# Patient Record
Sex: Male | Born: 1950 | Race: White | Hispanic: No | Smoking: Never smoker
Health system: Southern US, Community
[De-identification: ages and names within clinical notes are randomized; demographics above are authoritative.]

## PROBLEM LIST (undated history)

## (undated) DIAGNOSIS — E785 Hyperlipidemia, unspecified: Secondary | ICD-10-CM

## (undated) DIAGNOSIS — F329 Major depressive disorder, single episode, unspecified: Secondary | ICD-10-CM

## (undated) DIAGNOSIS — M1990 Unspecified osteoarthritis, unspecified site: Secondary | ICD-10-CM

## (undated) DIAGNOSIS — F32A Depression, unspecified: Secondary | ICD-10-CM

## (undated) DIAGNOSIS — Z87442 Personal history of urinary calculi: Secondary | ICD-10-CM

## (undated) DIAGNOSIS — D696 Thrombocytopenia, unspecified: Secondary | ICD-10-CM

## (undated) DIAGNOSIS — R161 Splenomegaly, not elsewhere classified: Secondary | ICD-10-CM

## (undated) DIAGNOSIS — K760 Fatty (change of) liver, not elsewhere classified: Secondary | ICD-10-CM

## (undated) DIAGNOSIS — K219 Gastro-esophageal reflux disease without esophagitis: Secondary | ICD-10-CM

## (undated) DIAGNOSIS — I10 Essential (primary) hypertension: Secondary | ICD-10-CM

## (undated) DIAGNOSIS — Z77098 Contact with and (suspected) exposure to other hazardous, chiefly nonmedicinal, chemicals: Secondary | ICD-10-CM

## (undated) DIAGNOSIS — K469 Unspecified abdominal hernia without obstruction or gangrene: Secondary | ICD-10-CM

## (undated) HISTORY — DX: Depression, unspecified: F32.A

## (undated) HISTORY — DX: Gastro-esophageal reflux disease without esophagitis: K21.9

## (undated) HISTORY — DX: Essential (primary) hypertension: I10

## (undated) HISTORY — DX: Major depressive disorder, single episode, unspecified: F32.9

## (undated) HISTORY — DX: Hyperlipidemia, unspecified: E78.5

---

## 2000-01-14 ENCOUNTER — Encounter: Admission: RE | Admit: 2000-01-14 | Discharge: 2000-01-14 | Payer: Self-pay | Admitting: Family Medicine

## 2000-01-14 ENCOUNTER — Encounter: Payer: Self-pay | Admitting: Family Medicine

## 2000-09-26 ENCOUNTER — Ambulatory Visit (HOSPITAL_BASED_OUTPATIENT_CLINIC_OR_DEPARTMENT_OTHER): Admission: RE | Admit: 2000-09-26 | Discharge: 2000-09-26 | Payer: Self-pay | Admitting: *Deleted

## 2000-12-09 ENCOUNTER — Ambulatory Visit (HOSPITAL_BASED_OUTPATIENT_CLINIC_OR_DEPARTMENT_OTHER): Admission: RE | Admit: 2000-12-09 | Discharge: 2000-12-09 | Payer: Self-pay | Admitting: *Deleted

## 2001-01-25 ENCOUNTER — Encounter (INDEPENDENT_AMBULATORY_CARE_PROVIDER_SITE_OTHER): Payer: Self-pay | Admitting: *Deleted

## 2001-01-25 ENCOUNTER — Ambulatory Visit (HOSPITAL_COMMUNITY): Admission: RE | Admit: 2001-01-25 | Discharge: 2001-01-26 | Payer: Self-pay | Admitting: *Deleted

## 2001-12-20 HISTORY — PX: UVULOPALATOPHARYNGOPLASTY: SHX827

## 2002-04-04 ENCOUNTER — Ambulatory Visit (HOSPITAL_COMMUNITY): Admission: RE | Admit: 2002-04-04 | Discharge: 2002-04-04 | Payer: Self-pay | Admitting: *Deleted

## 2002-04-04 ENCOUNTER — Encounter (INDEPENDENT_AMBULATORY_CARE_PROVIDER_SITE_OTHER): Payer: Self-pay | Admitting: Specialist

## 2003-07-01 ENCOUNTER — Encounter: Payer: Self-pay | Admitting: Family Medicine

## 2003-07-01 ENCOUNTER — Encounter: Admission: RE | Admit: 2003-07-01 | Discharge: 2003-07-01 | Payer: Self-pay | Admitting: Family Medicine

## 2004-12-31 ENCOUNTER — Ambulatory Visit (HOSPITAL_COMMUNITY): Admission: RE | Admit: 2004-12-31 | Discharge: 2004-12-31 | Payer: Self-pay | Admitting: *Deleted

## 2005-11-26 ENCOUNTER — Encounter: Admission: RE | Admit: 2005-11-26 | Discharge: 2006-02-24 | Payer: Self-pay | Admitting: Family Medicine

## 2005-12-03 ENCOUNTER — Encounter: Admission: RE | Admit: 2005-12-03 | Discharge: 2005-12-03 | Payer: Self-pay | Admitting: Family Medicine

## 2006-08-04 ENCOUNTER — Ambulatory Visit: Payer: Self-pay | Admitting: Family Medicine

## 2006-09-14 ENCOUNTER — Ambulatory Visit: Payer: Self-pay | Admitting: Family Medicine

## 2006-09-23 ENCOUNTER — Ambulatory Visit: Payer: Self-pay | Admitting: Family Medicine

## 2007-01-19 ENCOUNTER — Ambulatory Visit: Payer: Self-pay | Admitting: Family Medicine

## 2007-02-17 ENCOUNTER — Ambulatory Visit: Payer: Self-pay | Admitting: Family Medicine

## 2007-02-17 LAB — CONVERTED CEMR LAB
ALT: 31 units/L (ref 0–53)
AST: 27 units/L (ref 0–37)
BUN: 10 mg/dL (ref 6–23)
CO2: 26 meq/L (ref 19–32)
Calcium: 9 mg/dL (ref 8.4–10.5)
Chloride: 98 meq/L (ref 96–112)
Cholesterol: 281 mg/dL — ABNORMAL HIGH (ref 0–200)
Creatinine, Ser: 1.01 mg/dL (ref 0.40–1.50)
Glucose, Bld: 257 mg/dL — ABNORMAL HIGH (ref 70–99)
HDL: 25 mg/dL — ABNORMAL LOW (ref >39)
Hgb A1c MFr Bld: 8.2 % — ABNORMAL HIGH (ref 4.6–6.1)
Microalb, Ur: 1.31 mg/dL (ref 0.00–1.89)
Potassium: 4.5 meq/L (ref 3.5–5.3)
Sodium: 133 meq/L — ABNORMAL LOW (ref 135–145)
Total CHOL/HDL Ratio: 11.2
Triglycerides: 763 mg/dL — ABNORMAL HIGH (ref <150)

## 2007-02-28 ENCOUNTER — Ambulatory Visit: Payer: Self-pay | Admitting: Family Medicine

## 2007-02-28 LAB — CONVERTED CEMR LAB
ALT: 49 units/L — ABNORMAL HIGH (ref 0–40)
AST: 38 units/L — ABNORMAL HIGH (ref 0–37)
BUN: 8 mg/dL (ref 6–23)
CO2: 26 meq/L (ref 19–32)
Calcium: 8.8 mg/dL (ref 8.4–10.5)
Chloride: 101 meq/L (ref 96–112)
Cholesterol: 284 mg/dL (ref 0–200)
Creatinine, Ser: 0.8 mg/dL (ref 0.4–1.5)
Direct LDL: 59.5 mg/dL
GFR calc Af Amer: 129 mL/min
GFR calc non Af Amer: 106 mL/min
Glucose, Bld: 307 mg/dL — ABNORMAL HIGH (ref 70–99)
HDL: 30.1 mg/dL — ABNORMAL LOW (ref >39.0)
Potassium: 3.9 meq/L (ref 3.5–5.1)
Sodium: 134 meq/L — ABNORMAL LOW (ref 135–145)
Total CHOL/HDL Ratio: 9.4
Triglycerides: 918 mg/dL (ref 0–149)
VLDL: 184 mg/dL — ABNORMAL HIGH (ref 0–40)

## 2007-03-13 ENCOUNTER — Ambulatory Visit: Payer: Self-pay | Admitting: Family Medicine

## 2007-03-13 LAB — CONVERTED CEMR LAB
ALT: 36 units/L (ref 0–40)
AST: 33 units/L (ref 0–37)
Albumin: 3.5 g/dL (ref 3.5–5.2)
Alkaline Phosphatase: 81 units/L (ref 39–117)
BUN: 9 mg/dL (ref 6–23)
Bilirubin, Direct: 0.1 mg/dL (ref 0.0–0.3)
CO2: 26 meq/L (ref 19–32)
Calcium: 8.7 mg/dL (ref 8.4–10.5)
Chloride: 101 meq/L (ref 96–112)
Creatinine, Ser: 0.9 mg/dL (ref 0.4–1.5)
GFR calc Af Amer: 112 mL/min
GFR calc non Af Amer: 93 mL/min
Glucose, Bld: 381 mg/dL — ABNORMAL HIGH (ref 70–99)
Potassium: 3.6 meq/L (ref 3.5–5.1)
Sodium: 135 meq/L (ref 135–145)
Total Bilirubin: 1.2 mg/dL (ref 0.3–1.2)
Total Protein: 6.9 g/dL (ref 6.0–8.3)

## 2007-05-08 DIAGNOSIS — E785 Hyperlipidemia, unspecified: Secondary | ICD-10-CM

## 2007-05-08 DIAGNOSIS — E119 Type 2 diabetes mellitus without complications: Secondary | ICD-10-CM

## 2007-05-08 DIAGNOSIS — F329 Major depressive disorder, single episode, unspecified: Secondary | ICD-10-CM | POA: Insufficient documentation

## 2007-05-08 DIAGNOSIS — F3289 Other specified depressive episodes: Secondary | ICD-10-CM | POA: Insufficient documentation

## 2007-06-12 ENCOUNTER — Telehealth (INDEPENDENT_AMBULATORY_CARE_PROVIDER_SITE_OTHER): Payer: Self-pay | Admitting: *Deleted

## 2007-07-11 ENCOUNTER — Encounter (INDEPENDENT_AMBULATORY_CARE_PROVIDER_SITE_OTHER): Payer: Self-pay | Admitting: Family Medicine

## 2007-08-07 ENCOUNTER — Telehealth (INDEPENDENT_AMBULATORY_CARE_PROVIDER_SITE_OTHER): Payer: Self-pay | Admitting: *Deleted

## 2007-09-15 ENCOUNTER — Telehealth (INDEPENDENT_AMBULATORY_CARE_PROVIDER_SITE_OTHER): Payer: Self-pay | Admitting: *Deleted

## 2007-09-18 ENCOUNTER — Ambulatory Visit: Payer: Self-pay | Admitting: Family Medicine

## 2007-09-18 DIAGNOSIS — F988 Other specified behavioral and emotional disorders with onset usually occurring in childhood and adolescence: Secondary | ICD-10-CM | POA: Insufficient documentation

## 2007-09-18 DIAGNOSIS — K219 Gastro-esophageal reflux disease without esophagitis: Secondary | ICD-10-CM | POA: Insufficient documentation

## 2007-12-21 HISTORY — PX: CORONARY ARTERY BYPASS GRAFT: SHX141

## 2008-01-03 ENCOUNTER — Ambulatory Visit: Payer: Self-pay | Admitting: *Deleted

## 2008-01-03 ENCOUNTER — Telehealth (INDEPENDENT_AMBULATORY_CARE_PROVIDER_SITE_OTHER): Payer: Self-pay | Admitting: *Deleted

## 2008-01-05 ENCOUNTER — Ambulatory Visit: Payer: Self-pay | Admitting: *Deleted

## 2008-01-10 ENCOUNTER — Ambulatory Visit: Payer: Self-pay | Admitting: *Deleted

## 2008-01-17 ENCOUNTER — Ambulatory Visit: Payer: Self-pay | Admitting: *Deleted

## 2008-01-24 ENCOUNTER — Ambulatory Visit: Payer: Self-pay | Admitting: *Deleted

## 2008-01-31 ENCOUNTER — Ambulatory Visit: Payer: Self-pay | Admitting: *Deleted

## 2008-02-07 ENCOUNTER — Telehealth (INDEPENDENT_AMBULATORY_CARE_PROVIDER_SITE_OTHER): Payer: Self-pay | Admitting: *Deleted

## 2008-02-07 ENCOUNTER — Ambulatory Visit: Payer: Self-pay | Admitting: *Deleted

## 2008-02-16 ENCOUNTER — Ambulatory Visit: Payer: Self-pay | Admitting: *Deleted

## 2008-03-01 ENCOUNTER — Ambulatory Visit: Payer: Self-pay | Admitting: *Deleted

## 2008-03-20 ENCOUNTER — Ambulatory Visit: Payer: Self-pay | Admitting: *Deleted

## 2008-04-01 ENCOUNTER — Telehealth (INDEPENDENT_AMBULATORY_CARE_PROVIDER_SITE_OTHER): Payer: Self-pay | Admitting: *Deleted

## 2008-04-05 ENCOUNTER — Ambulatory Visit: Payer: Self-pay | Admitting: Family Medicine

## 2008-04-05 ENCOUNTER — Ambulatory Visit: Payer: Self-pay | Admitting: *Deleted

## 2008-05-17 ENCOUNTER — Ambulatory Visit: Payer: Self-pay | Admitting: Thoracic Surgery (Cardiothoracic Vascular Surgery)

## 2008-05-24 HISTORY — PX: BYPASS GRAFT: SHX909

## 2008-05-31 ENCOUNTER — Encounter: Payer: Self-pay | Admitting: Internal Medicine

## 2008-06-03 ENCOUNTER — Encounter: Payer: Self-pay | Admitting: Family Medicine

## 2008-06-20 ENCOUNTER — Telehealth (INDEPENDENT_AMBULATORY_CARE_PROVIDER_SITE_OTHER): Payer: Self-pay | Admitting: *Deleted

## 2008-06-25 ENCOUNTER — Ambulatory Visit: Payer: Self-pay | Admitting: Family Medicine

## 2008-06-25 DIAGNOSIS — G4700 Insomnia, unspecified: Secondary | ICD-10-CM | POA: Insufficient documentation

## 2008-07-12 ENCOUNTER — Encounter: Payer: Self-pay | Admitting: Family Medicine

## 2008-07-26 ENCOUNTER — Telehealth (INDEPENDENT_AMBULATORY_CARE_PROVIDER_SITE_OTHER): Payer: Self-pay | Admitting: *Deleted

## 2008-08-15 ENCOUNTER — Encounter: Payer: Self-pay | Admitting: Family Medicine

## 2008-08-28 ENCOUNTER — Telehealth (INDEPENDENT_AMBULATORY_CARE_PROVIDER_SITE_OTHER): Payer: Self-pay | Admitting: *Deleted

## 2008-09-25 ENCOUNTER — Encounter: Payer: Self-pay | Admitting: Family Medicine

## 2008-11-13 ENCOUNTER — Telehealth (INDEPENDENT_AMBULATORY_CARE_PROVIDER_SITE_OTHER): Payer: Self-pay | Admitting: *Deleted

## 2008-12-18 ENCOUNTER — Telehealth (INDEPENDENT_AMBULATORY_CARE_PROVIDER_SITE_OTHER): Payer: Self-pay | Admitting: *Deleted

## 2008-12-31 ENCOUNTER — Encounter: Payer: Self-pay | Admitting: Family Medicine

## 2009-01-29 ENCOUNTER — Telehealth (INDEPENDENT_AMBULATORY_CARE_PROVIDER_SITE_OTHER): Payer: Self-pay | Admitting: *Deleted

## 2009-02-12 ENCOUNTER — Encounter (INDEPENDENT_AMBULATORY_CARE_PROVIDER_SITE_OTHER): Payer: Self-pay | Admitting: *Deleted

## 2009-03-03 ENCOUNTER — Ambulatory Visit: Payer: Self-pay | Admitting: Family Medicine

## 2009-03-03 ENCOUNTER — Telehealth (INDEPENDENT_AMBULATORY_CARE_PROVIDER_SITE_OTHER): Payer: Self-pay | Admitting: *Deleted

## 2009-03-03 DIAGNOSIS — F411 Generalized anxiety disorder: Secondary | ICD-10-CM | POA: Insufficient documentation

## 2009-04-03 ENCOUNTER — Telehealth (INDEPENDENT_AMBULATORY_CARE_PROVIDER_SITE_OTHER): Payer: Self-pay | Admitting: *Deleted

## 2009-06-09 ENCOUNTER — Telehealth (INDEPENDENT_AMBULATORY_CARE_PROVIDER_SITE_OTHER): Payer: Self-pay | Admitting: *Deleted

## 2009-06-24 ENCOUNTER — Encounter: Payer: Self-pay | Admitting: Family Medicine

## 2009-07-11 ENCOUNTER — Telehealth (INDEPENDENT_AMBULATORY_CARE_PROVIDER_SITE_OTHER): Payer: Self-pay | Admitting: *Deleted

## 2009-12-20 HISTORY — PX: REFRACTIVE SURGERY: SHX103

## 2011-01-17 ENCOUNTER — Emergency Department (HOSPITAL_COMMUNITY)
Admission: EM | Admit: 2011-01-17 | Discharge: 2011-01-17 | Payer: Self-pay | Source: Home / Self Care | Admitting: Family Medicine

## 2011-01-27 ENCOUNTER — Encounter (HOSPITAL_COMMUNITY)
Admission: RE | Admit: 2011-01-27 | Discharge: 2011-01-27 | Disposition: A | Discharge status: Home / Self Care | Payer: BC Managed Care – PPO | Source: Ambulatory Visit | Attending: Plastic Surgery | Admitting: Plastic Surgery

## 2011-01-27 DIAGNOSIS — Z01812 Encounter for preprocedural laboratory examination: Secondary | ICD-10-CM | POA: Insufficient documentation

## 2011-01-27 LAB — TYPE AND SCREEN
ABO/RH(D): A POS
Antibody Screen: NEGATIVE

## 2011-01-27 LAB — BASIC METABOLIC PANEL
Calcium: 9.3 mg/dL (ref 8.4–10.5)
GFR calc Af Amer: >60 mL/min (ref >60)
GFR calc non Af Amer: >60 mL/min (ref >60)
Sodium: 135 mEq/L (ref 135–145)

## 2011-01-27 LAB — CBC
Hemoglobin: 13.6 g/dL (ref 13.0–17.0)
MCH: 29.5 pg (ref 26.0–34.0)
RBC: 4.61 MIL/uL (ref 4.22–5.81)

## 2011-01-27 LAB — PROTIME-INR
INR: 1.17 (ref 0.00–1.49)
Prothrombin Time: 15.1 seconds (ref 11.6–15.2)

## 2011-01-29 ENCOUNTER — Ambulatory Visit: Payer: Self-pay | Admitting: Family Medicine

## 2011-01-29 ENCOUNTER — Ambulatory Visit (HOSPITAL_COMMUNITY)
Admission: RE | Admit: 2011-01-29 | Discharge: 2011-01-29 | Disposition: A | Discharge status: Home / Self Care | Payer: BC Managed Care – PPO | Source: Ambulatory Visit | Attending: Plastic Surgery | Admitting: Plastic Surgery

## 2011-01-29 ENCOUNTER — Other Ambulatory Visit (HOSPITAL_COMMUNITY): Payer: Self-pay | Admitting: Plastic Surgery

## 2011-01-29 ENCOUNTER — Other Ambulatory Visit: Payer: Self-pay | Admitting: Plastic Surgery

## 2011-01-29 DIAGNOSIS — S62639B Displaced fracture of distal phalanx of unspecified finger, initial encounter for open fracture: Secondary | ICD-10-CM | POA: Insufficient documentation

## 2011-01-29 DIAGNOSIS — Z01811 Encounter for preprocedural respiratory examination: Secondary | ICD-10-CM

## 2011-01-29 DIAGNOSIS — Z01818 Encounter for other preprocedural examination: Secondary | ICD-10-CM | POA: Insufficient documentation

## 2011-01-29 DIAGNOSIS — IMO0002 Reserved for concepts with insufficient information to code with codable children: Secondary | ICD-10-CM | POA: Insufficient documentation

## 2011-01-29 LAB — GLUCOSE, CAPILLARY

## 2011-01-30 LAB — PREPARE PLATELETS

## 2011-02-17 NOTE — Op Note (Signed)
NAME:  Roger [name], Roger [name] NO.:  [id number]  MEDICAL RECORD NO.:  [id number]           PATIENT TYPE:  O  LOCATION:  XRAY                         FACILITY:  [facility]  PHYSICIAN:  Loreta Ave, MD DATE OF BIRTH:  10-31-1951  DATE OF PROCEDURE:  01/29/2011 DATE OF DISCHARGE:                              OPERATIVE REPORT   PREOPERATIVE DIAGNOSIS:  Left index fingertip amputation.  POSTOPERATIVE DIAGNOSIS:  Left index fingertip amputation.  PROCEDURE PERFORMED:  Revision amputation of left index fingertip amputation.  SURGEON:  Loreta Ave, MD.  ESTIMATED BLOOD LOSS:  2 mL.  COMPLICATIONS:  None.  IV FLUIDS:  Per Anesthesia including a 6-pack of platelets.  URINE OUTPUT:  Was not recorded.  CLINICAL INDICATION:  Roger [name] is a 60 year old male who suffered a left index fingertip amputation approximately 10 days ago.  He was referred to me as the hand surgeon on-call and followed up with me in the outpatient setting.  Of note, he has diabetes and idiopathic thrombocytopenia.  His platelet count was approximately 30,000.  He was referred for preoperative recommendations from hematologist who suggested that platelets at time of surgery be his best modality of preventing bleeding complications at the time of surgery.  He understood the risks of surgery to include but not be limited to bleeding, infection, damage to the nearby structures, stiffness in his index finger, chronic pain, chronic tip sensitivity as well as the need for more proximal amputation.  He desired to proceed.  DESCRIPTION OF OPERATION:  The patient was brought to the operating room, placed in supine position on the operating room table.  At the induction of general anesthesia with laryngeal mask airway, 6-pack of platelets was started by anesthesia.  The left upper extremity had a well-padded pneumatic tourniquet placed on the arm.  It was prepped with chlorhexidine and draped  into a sterile field.  Left upper extremity was exsanguinated with an Esmarch bandage and the tourniquet inflated at 250 mmHg.  The patient had a open distal phalanx wound that was approximately 1.5 x 3 cm with exposed bone.  Tenotomy scissors were used to elevate the skin off of the volar surface of the distal phalanx and a transverse incision was made at the dorsum of the level of the DIP joint.  The extensor tendon was divided with a 15 blade as were the collateral ligaments and the volar plate.  The index fingertip was then placed on traction and the flexor tendon was divided at the level of the DIP joint with 15 blade.  Next the volar neurovascular bundles were identified, dissected to the proximal portion of the wound, placed on tension and divided with bipolar electrocautery.  A volar hood was fashioned and trimmed to fit the fingertip.  Rongeur was used to remove the condylar head and smooth the articular surface of the middle phalanx at the DIP joint.  Cartilage was removed here.  A rasp was used to finish the smoothing process.  Next the wound was irrigated copiously with normal saline and bacitracin containing normal saline as well.  The tourniquet was deflated and hemostasis verified with bipolar electrocautery.  Burrow's triangles were trimmed medially and laterally off of the finger to facilitate closure.  The wound was then closed with interrupted 4-0 nylon sutures.  5 mL of 0.5% Marcaine plain were injected about the base of the finger for postoperative analgesia. Sponge and needle counts were reported as correct x2.  The patient was observed for a couple of minutes after wound closure to verify hemostasis.  Next, Xeroform, 2x2s, and Coban were then applied.  He was transferred to recovery room in stable condition.     Loreta Ave, MD     CF/MEDQ  D:  01/29/2011  T:  01/30/2011  Job:  [id number]  Electronically Signed by Loreta Ave MD on 02/17/2011  08:05:20 PM

## 2011-02-18 NOTE — Op Note (Signed)
NAME:  MARSELINO, SLAYTON NO.:  [id number]  MEDICAL RECORD NO.:  [id number]           PATIENT TYPE:  O  LOCATION:  XRAY                         FACILITY:  [facility]  PHYSICIAN:  Loreta Ave, MD DATE OF BIRTH:  1951/03/14  DATE OF PROCEDURE:  01/29/2011 DATE OF DISCHARGE:  01/29/2011                              OPERATIVE REPORT   PREOPERATIVE DIAGNOSIS:  Left index finger amputation.  POSTOPERATIVE DIAGNOSIS:  Left index finger amputation.  PROCEDURE PERFORMED:  Revision amputation of the left index finger.  SURGEON:  Loreta Ave, MD  ANESTHESIA:  General.  IV FLUIDS:  Per Anesthesia to include the six-pack of platelets.  ESTIMATED BLOOD LOSS:  Minimal.  COMPLICATIONS:  None.  CLINICAL INDICATION:  Barnes Florek is a 60 year old left-hand dominant male who previously sustained a traumatic amputation of the left index finger pulp as well as a open distal phalanx fracture.  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  He was referred to me as an outpatient and I recommended revision amputation. However, I encouraged him to see his hematologist for recommendations about the time of surgery.  They suggested platelets.  He understood the risks of surgery to include, but not be limited to bleeding, infection, damage to nearby structures, stiffness, scarring, the need for more proximal amputation as well as loss of grip strength and index finger tip sensitivity.  He desired to proceed.  DESCRIPTION OF OPERATION:  The patient was brought to the operating room and placed in the supine position on the operating room table. Platelets were begun at the induction of anesthesia.  The left upper extremity was prepped and draped in normal sterile fashion.  The wound was inspected and it was found that he had an open fracture with missing pulp of the index finger distal phalanx.  His index finger was incised with a 15 blade at the level  of the DIP joint.  The distal phalanx was disarticulated with a 15 blade and the extensor tendon was transected. The fingertip was placed on tension and the flexor tendon was then transected.  The neurovascular bundles on either side of the finger were dissected free, placed on traction, and divided with bipolar electrocautery.  Next, the condylar heads were trimmed with a rongeur as well as the articular cartilage of the middle phalanx at the DIP joint. It was then smoothed with a rasp.  Next, tourniquet was deflated and hemostasis was verified with bipolar electrocautery.  A volar hood flap was designed to cover the amputation site and folded dorsally.  Burrow's triangles were excised with a 15 blade.  The wound was closed with interrupted 4-0 nylon sutures.  After that Marcaine plain was injected about the base of the finger for postoperative analgesia.  A Xeroform as applied to the incision as was a bulky dry sterile dressing.  The patient tolerated the procedure well and was extubated and transferred to the recovery room in stable condition.     Loreta Ave, MD     CF/MEDQ  D:  02/17/2011  T:  02/17/2011  Job:  [id number]  Electronically Signed by Cristal Deer  Berlie Persky MD on 02/18/2011 08:05:55 AM

## 2011-02-27 HISTORY — PX: FINGER SURGERY: SHX640

## 2011-04-05 ENCOUNTER — Encounter: Payer: Self-pay | Admitting: Family Medicine

## 2011-04-06 ENCOUNTER — Ambulatory Visit (INDEPENDENT_AMBULATORY_CARE_PROVIDER_SITE_OTHER): Payer: BC Managed Care – PPO | Admitting: Family Medicine

## 2011-04-06 ENCOUNTER — Encounter: Payer: Self-pay | Admitting: Family Medicine

## 2011-04-06 DIAGNOSIS — E785 Hyperlipidemia, unspecified: Secondary | ICD-10-CM

## 2011-04-06 DIAGNOSIS — I2510 Atherosclerotic heart disease of native coronary artery without angina pectoris: Secondary | ICD-10-CM | POA: Insufficient documentation

## 2011-04-06 DIAGNOSIS — E119 Type 2 diabetes mellitus without complications: Secondary | ICD-10-CM

## 2011-04-06 DIAGNOSIS — G4700 Insomnia, unspecified: Secondary | ICD-10-CM

## 2011-04-06 DIAGNOSIS — N529 Male erectile dysfunction, unspecified: Secondary | ICD-10-CM

## 2011-04-06 DIAGNOSIS — F988 Other specified behavioral and emotional disorders with onset usually occurring in childhood and adolescence: Secondary | ICD-10-CM

## 2011-04-06 MED ORDER — SILDENAFIL CITRATE 100 MG PO TABS: 100.0000 mg | ORAL_TABLET | ORAL | Status: DC | PRN

## 2011-04-06 MED ORDER — ZOLPIDEM TARTRATE 10 MG PO TABS: 10.0000 mg | ORAL_TABLET | Freq: Every evening | ORAL | Status: AC | PRN

## 2011-04-06 MED ORDER — METHYLPHENIDATE HCL 20 MG PO TBCR: 20.0000 mg | EXTENDED_RELEASE_TABLET | Freq: Every day | ORAL | Status: DC

## 2011-04-06 NOTE — Assessment & Plan Note (Signed)
Try ambien 10 mg for 2-3 weeks --- no more than 3-4 x a week

## 2011-04-06 NOTE — Assessment & Plan Note (Signed)
con't meds Get labs from previous dr

## 2011-04-06 NOTE — Assessment & Plan Note (Signed)
Sample cialis given rx viagra

## 2011-04-06 NOTE — Assessment & Plan Note (Signed)
con't meds Will get records from previous Drs

## 2011-04-06 NOTE — Assessment & Plan Note (Signed)
con't meds Get labs Will follow

## 2011-04-06 NOTE — Assessment & Plan Note (Signed)
Refill ritalin

## 2011-04-06 NOTE — Patient Instructions (Addendum)
Please have medical records transferred here Schedule f/u for DM and lipids so that it is 3-6 months after last checkInsomnia Insomnia is frequent trouble falling and/or staying asleep. Insomnia can be a long term problem or a short term problem. Both are common. Insomnia can be a short term problem when the wakefulness is related to a certain stress or worry. Long term insomnia is often related to ongoing stress during waking hours and/or poor sleeping habits. Overtime, sleep deprivation itself can make the problem worse. Every little thing feels more severe because you are overtired and your ability to cope is decreased. SYMPTOMS  Not feeling rested in the morning.   Anxiety and restlessness at bedtime.   Difficulty falling and staying asleep.  CAUSES  Stress, anxiety, and depression.   Poor sleeping habits.   Distractions such as TV in the bedroom.   Naps close to bedtime.   Engaging in emotionally charged conversations before bed.   Technical reading before sleep.   Alcohol and other sedatives. They may make the problem worse. They can hurt normal sleep patterns and normal dream activity.   Stimulants such as caffeine for several hours prior to bedtime.   Pain syndromes and shortness of breath can cause insomnia.   Exercise late at night.   Changing time zones may cause sleeping problems (jet lag).  It is sometimes helpful to have someone observe your sleeping patterns. They should look for periods of not breathing during the night (sleep apnea). They should also look to see how long those periods last. If you live alone or observers are uncertain, you can also be observed at a sleep clinic where your sleep patterns will be professionally monitored. Sleep apnea requires a checkup and treatment. Give your caregivers your medical history. Give your caregivers observations your family has made about your sleep.  TREATMENT  Your caregiver may prescribe treatment for an underlying  medical disorders. Your caregiver can give advice or help if you are using alcohol or other drugs for self-medication. Treatment of underlying problems will usually eliminate insomnia problems.   Medications can be prescribed for short time use. They are generally not recommended for lengthy use.   Over-the-counter sleep medicines are not recommended for lengthy use. They can be habit forming.   You can promote easier sleeping by making lifestyle changes such as:   Using relaxation techniques that help with breathing and reduce muscle tension.   Exercising earlier in the day.   Changing your diet and the time of your last meal. No night time snacks.   Establish a regular time to go to bed.   Counseling can help with stressful problems and worry.   Soothing music and white noise may be helpful if there are background noises you cannot remove.   Stop tedious detailed work at least one hour before bedtime.  HOME CARE INSTRUCTIONS  Keep a diary. Inform your caregiver about your progress. This includes any medication side effects. See your caregiver regularly. Take note of:   Times when you are asleep.  Times when you are awake during the night.   The quality of your sleep.  How you feel the next day.   This information will help your caregiver care for you.  Get out of bed if you are still awake after 15 minutes. Read or do some quiet activity. Keep the lights down. Wait until you feel sleepy and go back to bed.   Keep regular sleeping and waking hours. Avoid naps.  Exercise regularly.   Avoid distractions at bedtime. Distractions include watching television or engaging in any intense or detailed activity like attempting to balance the household checkbook.   Develop a bedtime ritual. Keep a familiar routine of bathing, brushing your teeth, climbing into bed at the same time each night, listening to soothing music. Routines increase the success of falling to sleep faster.   Use  relaxation techniques. This can be using breathing and muscle tension release routines. It can also include visualizing peaceful scenes. You can also help control troubling or intruding thoughts by keeping your mind occupied with boring or repetitive thoughts like the old concept of counting sheep. You can make it more creative like imagining planting one beautiful flower after another in your backyard garden.   During your day, work to eliminate stress. When this is not possible use some of the previous suggestions to help reduce the anxiety that accompanies stressful situations.  MAKE SURE YOU:   Understand these instructions.   Will watch your condition.   Will get help right away if you are not doing well or get worse.  Document Released: 12/03/2000 Document Re-Released: 11/18/2008 [facility] Patient Information 2011 [place], [place].

## 2011-04-06 NOTE — Progress Notes (Signed)
  Subjective:    Patient ID: Roger [name], male    DOB: [birth year], 60 y.o.   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  Pt is most recently having trouble sleeping for last 2 months.  Pt is walking a lot but thinks he is just afraid of the unknown.  He is retiring from [employer] May 1.  He is not sure what he will do next.    ROS See HPI above   PMH Smoking Status noted    Review of Systems  Constitutional: Negative for diaphoresis and fatigue.  Respiratory: Negative for choking, chest tightness and shortness of breath.   Cardiovascular: Negative for chest pain, palpitations and leg swelling.  All other systems reviewed and are negative.      as above Objective:   Physical Exam  Constitutional: He is oriented to person, place, and time. He appears well-developed and well-nourished. No distress.  Neck: Normal range of motion. Neck supple.  Cardiovascular: Normal rate, regular rhythm and normal heart sounds.   Pulmonary/Chest: Breath sounds normal.  Musculoskeletal: He exhibits no edema.  Neurological: He is alert and oriented to person, place, and time.  Skin: He is not diaphoretic.  Psychiatric: He has a normal mood and affect.          Assessment & Plan:

## 2011-04-14 ENCOUNTER — Telehealth: Payer: Self-pay | Admitting: *Deleted

## 2011-04-14 NOTE — Telephone Encounter (Signed)
PA in process awaiting fax. 

## 2011-04-14 NOTE — Telephone Encounter (Signed)
PA faxed back awaiting response. 

## 2011-04-15 NOTE — Telephone Encounter (Signed)
Prior Auth approved from 03-24-11 until 04-13-12, pharmacy faxed, approval letter scan to chart.

## 2011-05-07 NOTE — Op Note (Signed)
[place]  Patient:    Roger [name], Roger [name]                    MRN: [id number] Proc. Date: 01/25/01 Adm. Date:  [id number] Attending:  Carlena Sax CC:         Dr. Stefano Gaul of [facility] at [facility]   Operative Report  PREOPERATIVE DIAGNOSES: 1. Nasal airway obstruction. 2. Nasal septal deviation. 3. Inferior turbinate hypertrophy. 4. Obstructive sleep apnea. 5. Elongated soft palate and uvula.  POSTOPERATIVE DIAGNOSES: 1. Nasal airway obstruction. 2. Nasal septal deviation. 3. Inferior turbinate hypertrophy. 4. Obstructive sleep apnea. 5. Elongated soft palate and uvula.  PROCEDURES: 1. Uvulopalatopharyngoplasty. 2. Nasal septal reconstruction. 3. Intramural cauterization of both inferior turbinates.  SURGEON:  Veverly Fells. Arletha Grippe, M.D.  ANESTHESIA:  General endotracheal.  INDICATION FOR SURGERY:  This is a 60 year old white male with a long history of loud snoring at night, breath obstruction episodes noted by his wife, and nasal airway obstruction.  This has been worse over the past five years, and patients polysomnogram obtained at [facility] on December 09, 2000, did reveal moderate obstructive sleep apnea with an RDI of 22 events per hour, desaturations down to 86%.  He is status post tonsillectomy in the past. Physical examination does reveal elongated soft palate and uvula, inferior turbinate congestion and hypertrophy, and a severe septal deviation to the right.  Based on his history and physical examination, I have recommended proceeding with the above-noted surgical procedure.  I have discussed extensively with him and his family the risks and benefits of surgery, including risks of general anesthesia, infection, bleeding, need for septal splinting, and possibility of velopharyngeal insufficiency.  I have entertained any questions, answered them appropriately.  Informed consent has been obtained, and  patient presents for the above-noted procedure.  OPERATIVE FINDINGS:  Elongated soft palate and uvula.  Redundant posterior pharyngeal mucosa.  Severe septal deviation to the right.  Congestion of the inferior turbinates.  DESCRIPTION OF PROCEDURE:  The patient was brought into the operating room and placed in the supine position and general endotracheal anesthesia administered via the anesthesiologist without complication.  Patient administered 1 g of Ancef IV x 1, 10 mg of Decadron IV x 1.  Head was turned 90 degrees. Patients face was draped in standard fashion.  Crowe-Davis mouth retractor was inserted into oral cavity.  This was used to retract the mouth open. Next, the Harmonic scalpel was then used to remove portions of mucosa of the anterior tonsillar pillar and horizontally the uvula and portion of soft palate.  This was done in such a way as to preserve enough soft palate to prevent VPI.  Bleeding was controlled with a combination of Harmonic scalpel and suction cautery without difficulty.  After this was done, the portion of the soft palate, uvula, and anterior tonsillar pillars was removed through the oral cavity and sent to surgical pathology for gross pathology only.  The area was irrigated with a copious amount of saline irrigation fluid, suctioned dry. Thee was no evidence of any active bleeding.  Anterior tonsillar pillars and cut edges of the soft palate were reapproximated with multiple interrupted 3-0 Vicryl suture.  A total of 4 cc of 0.5% Marcaine solution and 1:200,000 epinephrine were infiltrated in the anterior tonsillar pillar, tongue base, and soft palate without difficulty.  The Crowe-Davis mouth retractor was released, brought out through the oral cavity without incident.  Next, attention was turned to  the nasal chamber.  Cotton pledgets soaked in a 4% cocaine solution were placed in each naris, left in place for approximately five to 10 minutes and then  removed.  Both sides of the septum were infiltrated with a 1% lidocaine solution with 1:100,000 epinephrine.  After waiting approximately 10 minutes, a standard Killian incision was made on the left side of the septum and mucoperichondrial and mucoperiosteal flap was elevated on the left side using both blunt and sharp dissection.  An intercartilaginous incision was made approximately 1 cm posterior _____ septum.  Mucoperichondrial and mucoperiosteal flap was elevated on the right side using both blunt and sharp dissection.  Cartilaginous deviation was removed from the _____.  Posterior bony deflection was removed with the Jansen-Middleton forceps.  A septal spur, which was pushing off into the left nasal chamber, was removed with a combination of the Takahashi forceps and the Risk analyst without difficulty.  A piece of trimmed morcellized cartilage was placed in between the septal flaps.  The septal incision was closed with interrupted chromic suture, and the septum was reinforced with a running transseptal plain gut mattress stitch.  Both inferior turbinates were injected with a total of 6 cc of a 1% lidocaine solution with 1:100,000 epinephrine. Both inferior turbinates were then intramurally cauterized using the Elmed intramural cauterization unit on a 12 watt setting.  Both inferior turbinates were cauterized using three passes of the cauterization unit without difficulty, and then they were outfractured using gentle lateral pressure and a large nasal speculum.  Doyle splint soaked in a Bactroban ointment solution were placed on either side of the septum, held in place with a transseptal Prolene suture.  The orogastric tube was placed.  This was used to decompress the stomach contents.  It was then removed.  Fluids given during the procedure:  Approximately  1 L of crystalloid.  Estimated blood loss is less than 30 cc.  Urine output was not measured.  No drains.  The above-noted  two Doyle splints were placed.  Specimens sent were septal cartilage and bone and uvula and portion of soft palate for gross pathology only.  Patient tolerated  the procedure well without complications, was extubated in the operating room and transferred to the recovery room in stable condition.  Sponge, instrument, and needle counts correct at the end of the procedure.  Total duration of procedure is approximately two hours.  The patient will be admitted for overnight recovery.  Once he has recovered well, he will be sent home on January 26, 2001, and will be sent home on Augmentin elixir 400 mg p.o. t.i.d. for 10 days; Lortab elixir 250 cc with two refills, 10-15 cc p.o. q.4h. p.r.n. pain; and Vioxx 50 mg tablets p.o. q.d. for 10 days.  Both the patient and his family were given oral and written instructions.  They are to call if they have any problems with bleeding, fever, or vomiting, pain, reaction to medications, or any other questions.  He will follow up in the office for splint removal on Wednesday, February 13 at 1:50 p.m. DD:  01/25/01 TD:  01/26/01 Job: [id number]

## 2013-06-12 ENCOUNTER — Encounter (HOSPITAL_COMMUNITY): Payer: Self-pay | Admitting: Emergency Medicine

## 2013-06-12 ENCOUNTER — Emergency Department (HOSPITAL_COMMUNITY): Payer: Non-veteran care

## 2013-06-12 ENCOUNTER — Emergency Department (HOSPITAL_COMMUNITY)
Admission: EM | Admit: 2013-06-12 | Discharge: 2013-06-12 | Disposition: A | Discharge status: Home / Self Care | Payer: Non-veteran care | Attending: Emergency Medicine | Admitting: Emergency Medicine

## 2013-06-12 DIAGNOSIS — K8020 Calculus of gallbladder without cholecystitis without obstruction: Secondary | ICD-10-CM | POA: Insufficient documentation

## 2013-06-12 DIAGNOSIS — Z951 Presence of aortocoronary bypass graft: Secondary | ICD-10-CM | POA: Insufficient documentation

## 2013-06-12 DIAGNOSIS — E785 Hyperlipidemia, unspecified: Secondary | ICD-10-CM | POA: Insufficient documentation

## 2013-06-12 DIAGNOSIS — F3289 Other specified depressive episodes: Secondary | ICD-10-CM | POA: Insufficient documentation

## 2013-06-12 DIAGNOSIS — Z794 Long term (current) use of insulin: Secondary | ICD-10-CM | POA: Insufficient documentation

## 2013-06-12 DIAGNOSIS — N209 Urinary calculus, unspecified: Secondary | ICD-10-CM | POA: Insufficient documentation

## 2013-06-12 DIAGNOSIS — R109 Unspecified abdominal pain: Secondary | ICD-10-CM | POA: Insufficient documentation

## 2013-06-12 DIAGNOSIS — I10 Essential (primary) hypertension: Secondary | ICD-10-CM | POA: Insufficient documentation

## 2013-06-12 DIAGNOSIS — R748 Abnormal levels of other serum enzymes: Secondary | ICD-10-CM | POA: Insufficient documentation

## 2013-06-12 DIAGNOSIS — F329 Major depressive disorder, single episode, unspecified: Secondary | ICD-10-CM | POA: Insufficient documentation

## 2013-06-12 DIAGNOSIS — Z79899 Other long term (current) drug therapy: Secondary | ICD-10-CM | POA: Insufficient documentation

## 2013-06-12 DIAGNOSIS — K219 Gastro-esophageal reflux disease without esophagitis: Secondary | ICD-10-CM | POA: Insufficient documentation

## 2013-06-12 DIAGNOSIS — E119 Type 2 diabetes mellitus without complications: Secondary | ICD-10-CM | POA: Insufficient documentation

## 2013-06-12 DIAGNOSIS — M549 Dorsalgia, unspecified: Secondary | ICD-10-CM | POA: Insufficient documentation

## 2013-06-12 LAB — URINALYSIS, ROUTINE W REFLEX MICROSCOPIC
Ketones, ur: NEGATIVE mg/dL
Leukocytes, UA: NEGATIVE
Nitrite: NEGATIVE
Protein, ur: NEGATIVE mg/dL
pH: 7 (ref 5.0–8.0)

## 2013-06-12 LAB — CBC WITH DIFFERENTIAL/PLATELET
Basophils Absolute: 0 10*3/uL (ref 0.0–0.1)
Basophils Relative: 1 % (ref 0–1)
Eosinophils Absolute: 0.1 10*3/uL (ref 0.0–0.7)
MCH: 30.2 pg (ref 26.0–34.0)
MCHC: 35.6 g/dL (ref 30.0–36.0)
Monocytes Relative: 9 % (ref 3–12)
Neutro Abs: 2 10*3/uL (ref 1.7–7.7)
Neutrophils Relative %: 59 % (ref 43–77)
Platelets: 21 10*3/uL — CL (ref 150–400)
RDW: 13.8 % (ref 11.5–15.5)

## 2013-06-12 LAB — TYPE AND SCREEN: ABO/RH(D): A POS

## 2013-06-12 LAB — POCT I-STAT, CHEM 8
BUN: 6 mg/dL (ref 6–23)
Calcium, Ion: 1.26 mmol/L (ref 1.13–1.30)
Creatinine, Ser: 0.9 mg/dL (ref 0.50–1.35)
Hemoglobin: 13.3 g/dL (ref 13.0–17.0)
TCO2: 24 mmol/L (ref 0–100)

## 2013-06-12 LAB — HEPATIC FUNCTION PANEL
Albumin: 3.3 g/dL — ABNORMAL LOW (ref 3.5–5.2)
Alkaline Phosphatase: 97 U/L (ref 39–117)
Indirect Bilirubin: 1.4 mg/dL — ABNORMAL HIGH (ref 0.3–0.9)
Total Protein: 7.1 g/dL (ref 6.0–8.3)

## 2013-06-12 MED ORDER — SODIUM CHLORIDE 0.9 % IV SOLN
Freq: Once | INTRAVENOUS | Status: AC
  Administered 2013-06-12: 20:00:00 via INTRAVENOUS

## 2013-06-12 MED ORDER — IOHEXOL 300 MG/ML  SOLN
50.0000 mL | Freq: Once | INTRAMUSCULAR | Status: AC | PRN
  Administered 2013-06-12: 50 mL via ORAL

## 2013-06-12 MED ORDER — TAMSULOSIN HCL 0.4 MG PO CAPS: 0.4000 mg | ORAL_CAPSULE | Freq: Every day | ORAL | Status: DC

## 2013-06-12 MED ORDER — ONDANSETRON 8 MG PO TBDP: 8.0000 mg | ORAL_TABLET | Freq: Three times a day (TID) | ORAL | Status: DC | PRN

## 2013-06-12 MED ORDER — HYDROCODONE-ACETAMINOPHEN 5-325 MG PO TABS: 1.0000 | ORAL_TABLET | Freq: Four times a day (QID) | ORAL | Status: DC | PRN

## 2013-06-12 MED ORDER — IOHEXOL 300 MG/ML  SOLN
100.0000 mL | Freq: Once | INTRAMUSCULAR | Status: AC | PRN
  Administered 2013-06-12: 100 mL via INTRAVENOUS

## 2013-06-12 NOTE — ED Notes (Signed)
Ultrasound at bedside

## 2013-06-12 NOTE — ED Provider Notes (Signed)
History    CSN: [id number] Arrival date & time 06/12/13  9562  First MD Initiated Contact with Patient 06/12/13 1900     Chief Complaint  Patient presents with  . Abdominal Pain  . Back Pain   (Consider location/radiation/quality/duration/timing/severity/associated sxs/prior Treatment) HPI Comments: Pt has hx of DM, CAD, s/p CABG, thrombocytopenia and fatty liver dz. Patient states that he has been having some back pain - starting between the shoulder blades and moving to the flank line x 4 days. The pain is sharp, throbbing and constant. Starting this afternoon, patient has been having diffuse abd pain - also sharp in nature The pain is diffuse, and severe and started after he drank water. He has nausea, no emesis, no diarrhea. Pt has hx of renal stones.  The history is provided by the patient.   Past Medical History  Diagnosis Date  . Depression   . Diabetes mellitus   . GERD (gastroesophageal reflux disease)   . Hypertension   . Hyperlipidemia    Past Surgical History  Procedure Laterality Date  . Bypass graft  05-24-08    triple dr [place] Crane --[facility]  . Finger surgery  02/27/2011    amputated left ring finger  . Coronary artery bypass graft     Family History  Problem Relation Age of Onset  . Diabetes Mother   . Heart disease Mother     chf  . Cancer Brother 32    esopageal   History  Substance Use Topics  . Smoking status: Never Smoker   . Smokeless tobacco: Never Used  . Alcohol Use: No    Review of Systems  Constitutional: Negative for fever, chills and activity change.  HENT: Negative for neck pain.   Eyes: Negative for visual disturbance.  Respiratory: Negative for cough, chest tightness and shortness of breath.   Cardiovascular: Negative for chest pain.  Gastrointestinal: Positive for nausea and abdominal pain. Negative for vomiting, diarrhea, constipation, blood in stool, abdominal distention and rectal pain.  Genitourinary: Negative for dysuria, hematuria,  enuresis and difficulty urinating.  Musculoskeletal: Positive for back pain. Negative for arthralgias.  Neurological: Negative for dizziness, light-headedness and headaches.  Psychiatric/Behavioral: Negative for confusion.    Allergies  Review of patient's allergies indicates no known allergies.  Home Medications   Current Outpatient Rx  Name  Route  Sig  Dispense  Refill  . atorvastatin (LIPITOR) 80 MG tablet   Oral   Take 40 mg by mouth every evening.         . carvedilol (COREG) 6.25 MG tablet   Oral   Take 6.25 mg by mouth 2 (two) times daily.         Marland Kitchen CINNAMON PO   Oral   Take 1 capsule by mouth every morning.         . furosemide (LASIX) 20 MG tablet   Oral   Take 20 mg by mouth every morning.         . insulin glargine (LANTUS) 100 UNIT/ML injection   Subcutaneous   Inject 10 Units into the skin at bedtime.         Marland Kitchen omeprazole (PRILOSEC) 20 MG capsule   Oral   Take 20 mg by mouth every evening.         Marland Kitchen spironolactone (ALDACTONE) 25 MG tablet   Oral   Take 25 mg by mouth every morning.         . traZODone (DESYREL) 100 MG tablet   Oral  Take 100 mg by mouth at bedtime.          BP 151/65  Pulse 75  Temp(Src) 98.4 F (36.9 C) (Oral)  Resp 18  SpO2 97% Physical Exam  Nursing note and vitals reviewed. Constitutional: He is oriented to person, place, and time. He appears well-developed.  HENT:  Head: Normocephalic and atraumatic.  Eyes: Conjunctivae and EOM are normal. Pupils are equal, round, and reactive to light.  Neck: Normal range of motion. Neck supple.  Cardiovascular: Normal rate, regular rhythm and intact distal pulses.   2+ and equal femoral pulses, 2+ DP, left stronger than right.  Pulmonary/Chest: Effort normal and breath sounds normal.  Abdominal: Soft. Bowel sounds are normal. He exhibits no distension. There is tenderness. There is no rebound and no guarding.  Diffuse tenderness to palpation, worse in the upper  quadrants. No CVA tenderness.  Neurological: He is alert and oriented to person, place, and time.  Skin: Skin is warm.    ED Course  Procedures (including critical care time) Labs Reviewed  POCT I-STAT, CHEM 8 - Abnormal; Notable for the following:    Glucose, Bld 214 (*)    All other components within normal limits  CBC WITH DIFFERENTIAL  LIPASE, BLOOD  HEPATIC FUNCTION PANEL  URINALYSIS, ROUTINE W REFLEX MICROSCOPIC  CG4 I-STAT (LACTIC ACID)  TYPE AND SCREEN   No results found. No diagnosis found.  MDM  DDx includes: Pancreatitis Hepatobiliary pathology including cholecystitis Gastritis/PUD SBO ACS syndrome Aortic Dissection Colitis AAA Tumors Colitis Intra abdominal abscess Thrombosis Mesenteric ischemia Diverticulitis Peritonitis Appendicitis Hernia Nephrolithiasis Pyelonephritis UTI/Cystitis  Pt comes in with cc of abd pain, radiating to the back. Diffuse abd pain, worse over the right sided. Has hx of renal stones, but cholelithiasis possible as well. AAA possible too.  We will get basic labs and appropriate imaging.  11:32 PM Pt has gall stones and renal stones. Spoke with General surgery, as lipase is elevated. They agree, no need to go to the OR now. He is pain free, which is reassuring.  i have asked patient to see the Heme team, as no surgeon will take him to the OR for elective procedures. He understands.        Derwood Kaplan, MD 06/12/13 2333

## 2013-06-12 NOTE — ED Notes (Signed)
Patient transported to CT 

## 2013-06-12 NOTE — ED Notes (Addendum)
Pt c/o upper abdominal pain x 1 days and mid area back pain x 4 days. Denies n/v/d. Son states that pt has been off balance before all this pain started.

## 2013-08-27 IMAGING — CT CT ABD-PELV W/ CM
1 of 3 series · 14 of 32 positions shown, 19 images · IV contrast (OMNIPAQUE 300)
Comparison: Ultrasound earlier today.

CLINICAL DATA: Abdominal pain, mid back pain.

CT ABDOMEN AND PELVIS WITH CONTRAST
TECHNIQUE: Multidetector CT imaging of the abdomen and pelvis was
performed following the standard protocol during bolus
administration of intravenous contrast.
Contrast: 100mL OMNIPAQUE IOHEXOL 300 MG/ML  SOLN

[Series 2: abd/pel with · axial · 0.92mm/px · z∈[+1246,+1676]mm · 14 of 98 slices shown, 19 images]
[im 6/98  soft-tissue]
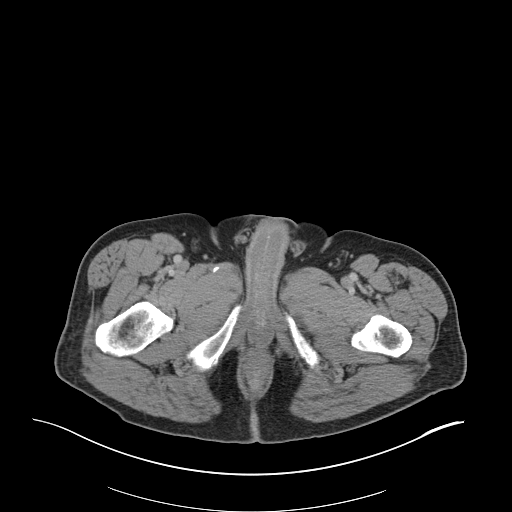
[im 6/98  bone]
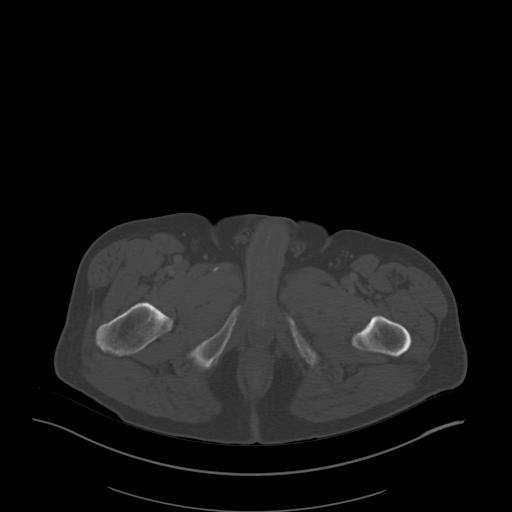
[im 11/98  soft-tissue]
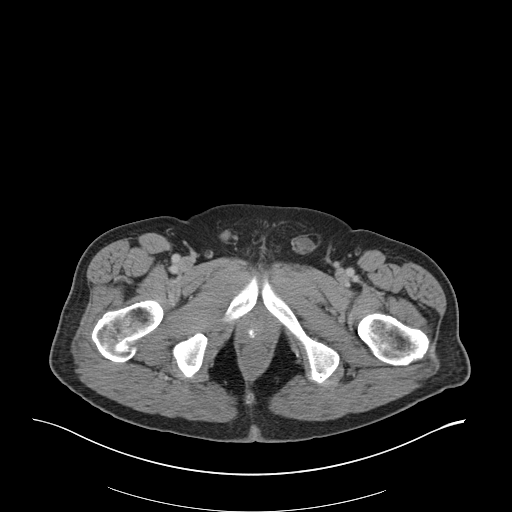
[im 22/98  soft-tissue]
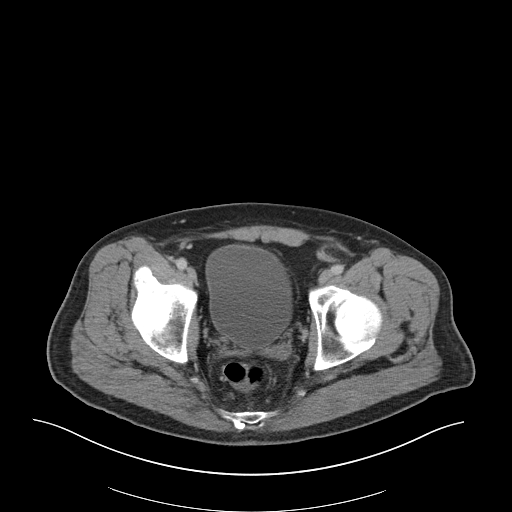
[im 27/98  soft-tissue]
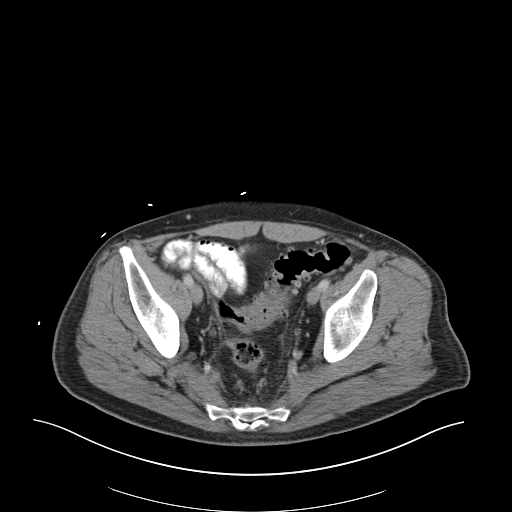
[im 33/98  soft-tissue]
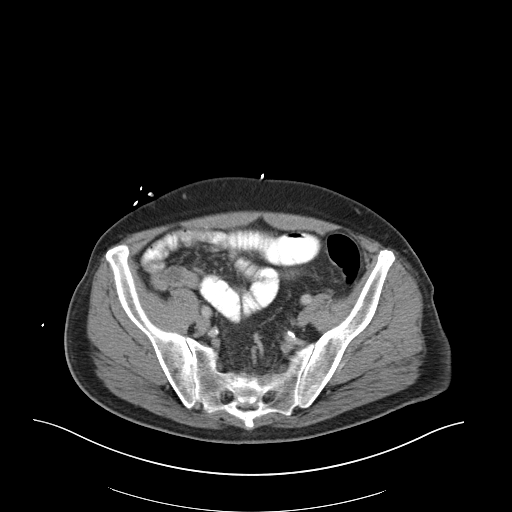
[im 44/98  soft-tissue]
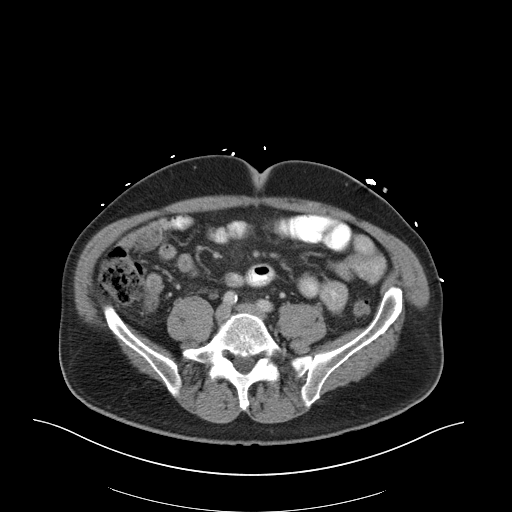
[im 49/98  soft-tissue]
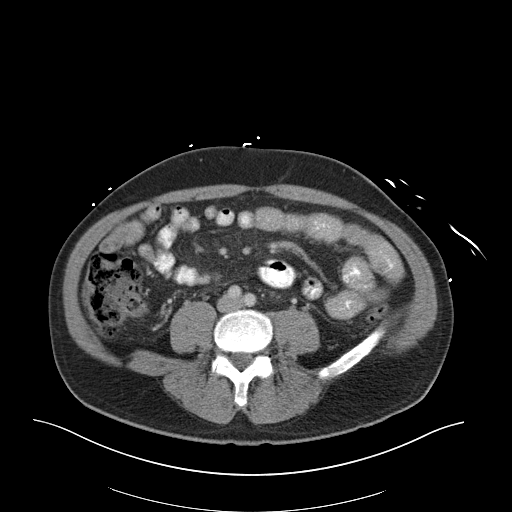
[im 54/98  soft-tissue]
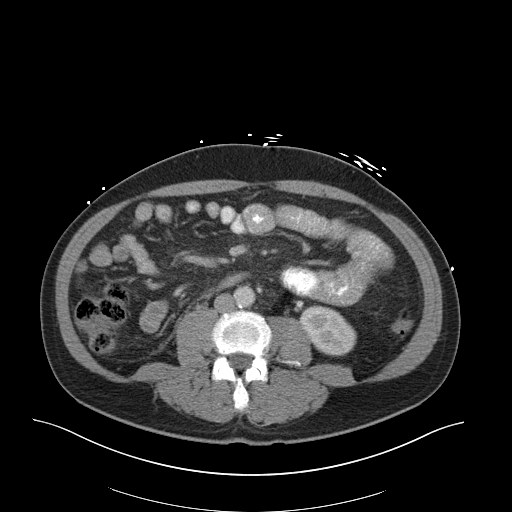
[im 65/98  soft-tissue]
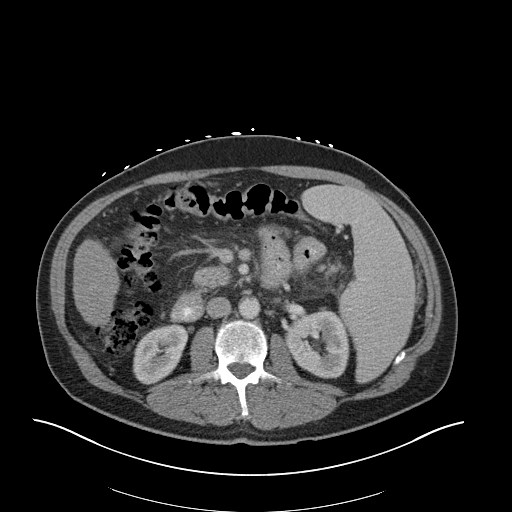
[im 65/98  bone]
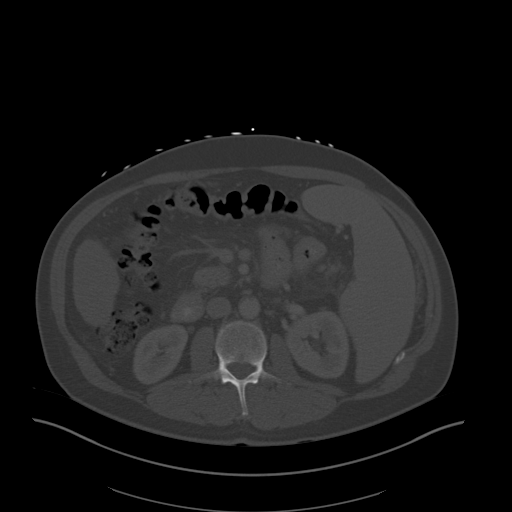
[im 71/98  soft-tissue]
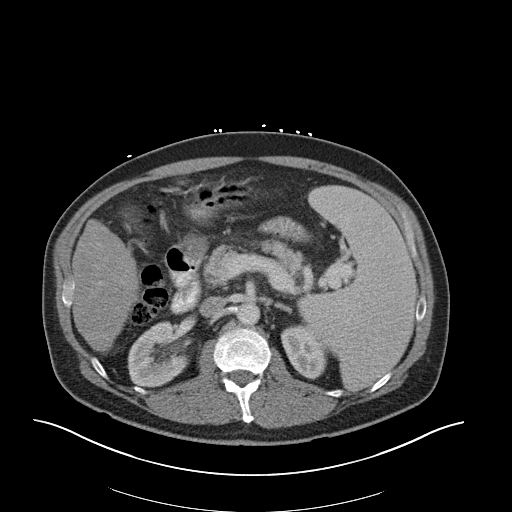
[im 76/98  soft-tissue]
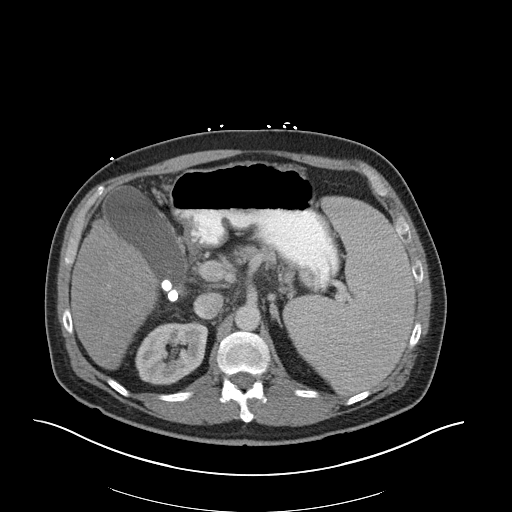
[im 76/98  lung]
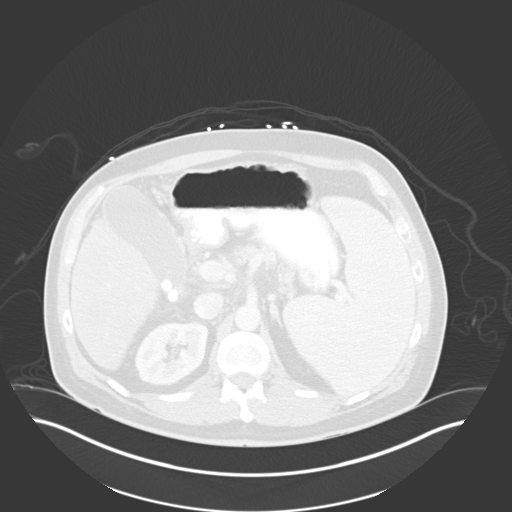
[im 81/98  lung]
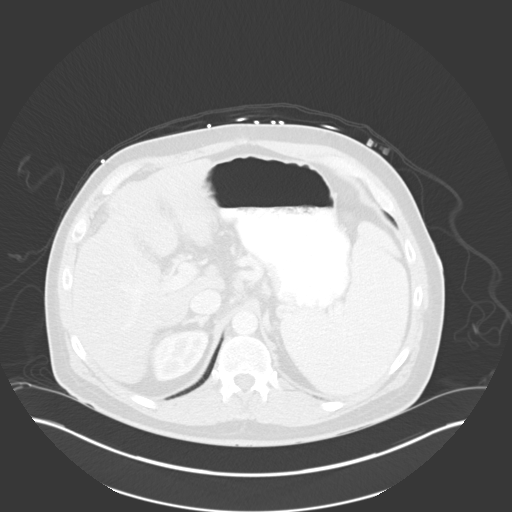
[im 87/98  soft-tissue]
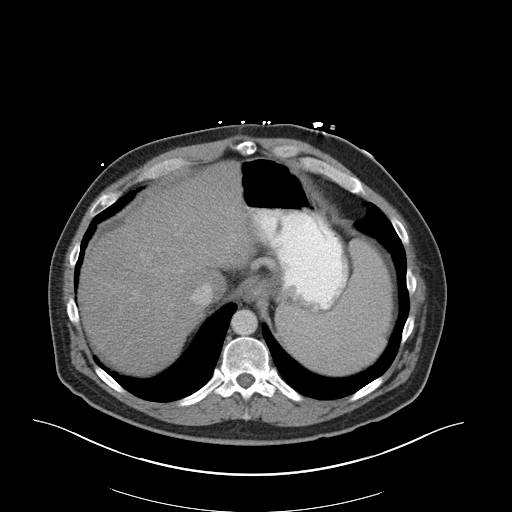
[im 87/98  lung]
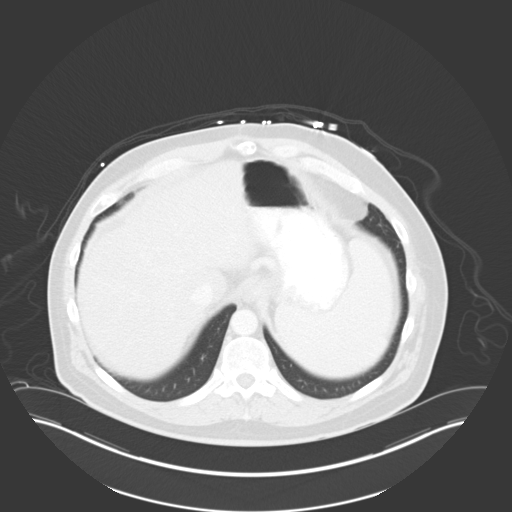
[im 92/98  soft-tissue]
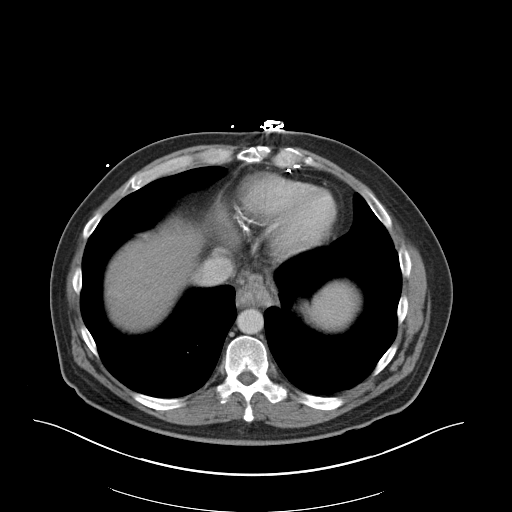
[im 92/98  lung]
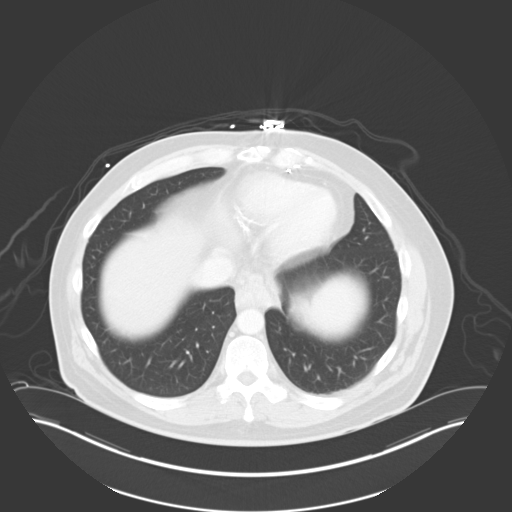

[14 of 32 positions shown; findings below may reference images not displayed]

FINDINGS: [The CABG changes.  Heart is normal size.  Lung bases are
clear.  No effusions.

Several gallstones noted within the gallbladder.  Small amount of
ascites adjacent to the liver.  No focal abnormality within the
liver.  Spleen is not enlarged.  Slight nodular contours of the
liver surface suggests possibility of cirrhosis as the cause of the
splenomegaly.

Stomach, pancreas, adrenals and left kidney are unremarkable.

Scattered sigmoid diverticulosis.  No active diverticulitis.  Small
bowel is decompressed.

There is slight fullness of the right renal collecting system and
ureter.  At least two adjacent distal right ureteral stones at the
right ureteral vesicle junction.  These are well visualized on the
coronal image 65 and measure approximately 7 mm and 5 mm in
diameter.  No left renal stones or hydronephrosis. Small
nonobstructing right renal stones.

Small left inguinal hernia containing fat.  No acute bony
abnormality.
IMPRESSION: Right nephrolithiasis.  2 right UVJ stones with slight fullness of
the right renal collecting system and ureter.

Cholelithiasis.

Configuration and appearance of the liver suggest cirrhosis.  Small
amount of perihepatic ascites.  Splenomegaly.

## 2013-08-27 IMAGING — US US AORTA
1 series · 14 of 25 positions shown · non-contrast
Comparison: CT performed today.

CLINICAL DATA: Abdominal pain and back pain.

ULTRASOUND OF ABDOMINAL AORTA
TECHNIQUE: Ultrasound examination of the abdominal aorta was
performed to evaluate for abdominal aortic aneurysm.

[Series 1: us aorta · 0.41mm/px · 14 of 37 slices shown]
[im 1/37]
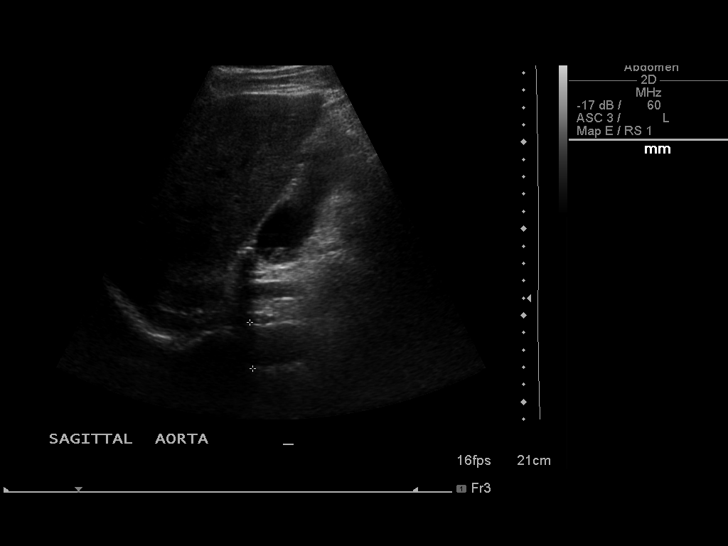
[im 4/37]
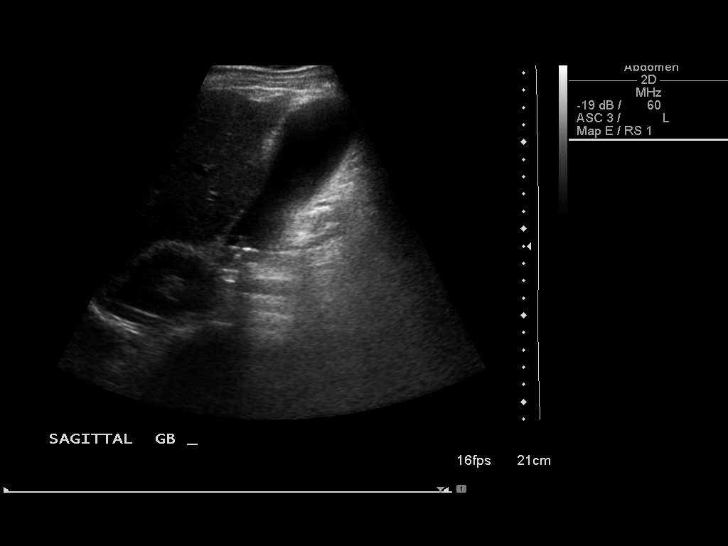
[im 7/37]
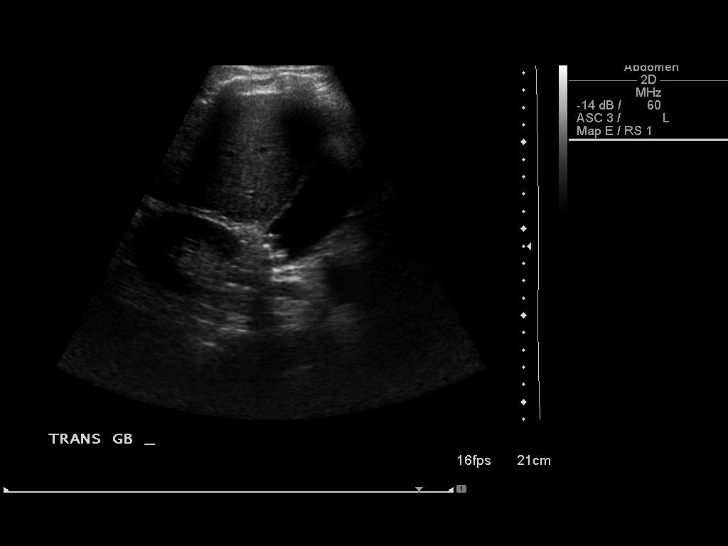
[im 10/37]
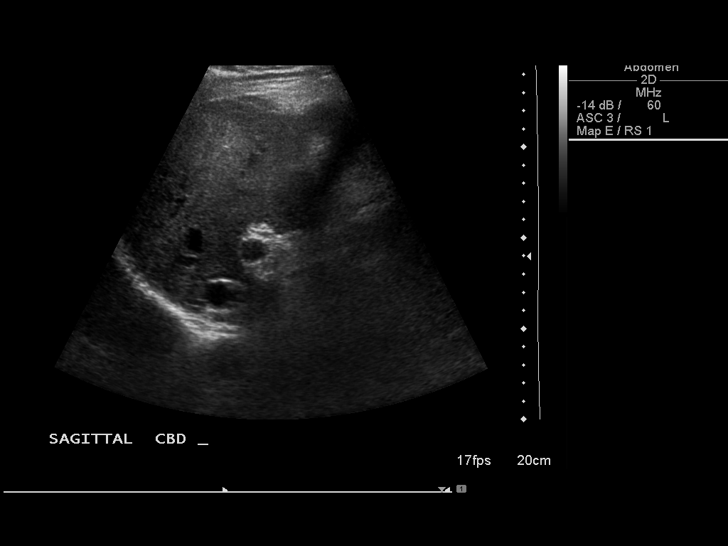
[im 13/37]
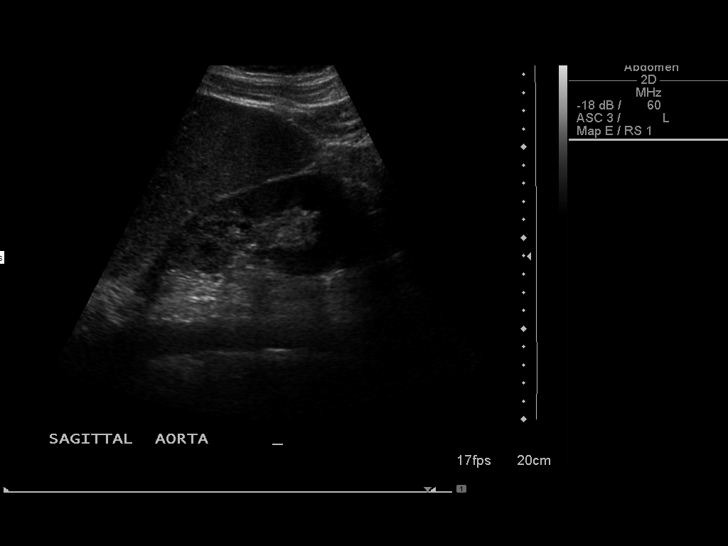
[im 14/37]
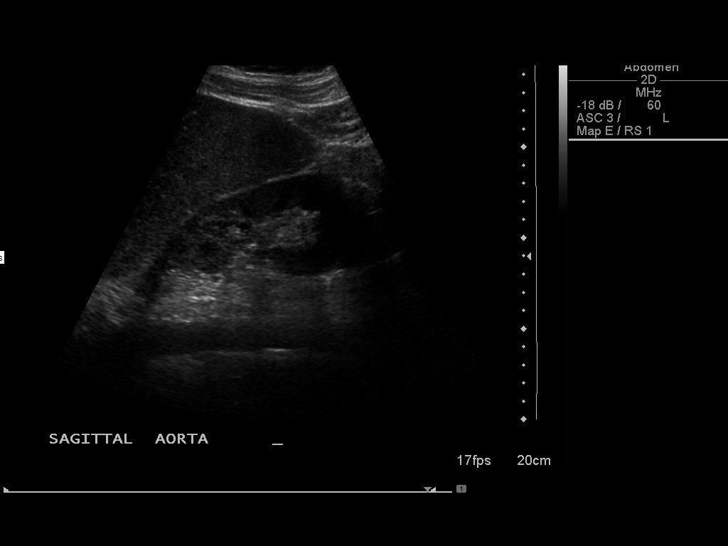
[im 17/37]
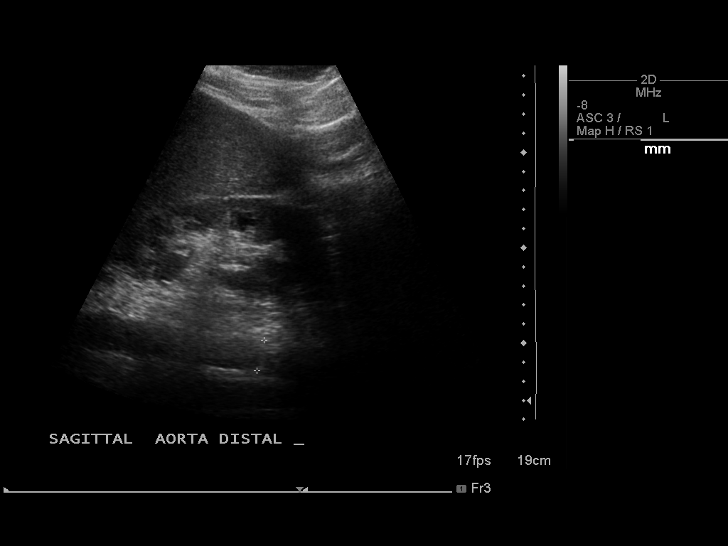
[im 20/37]
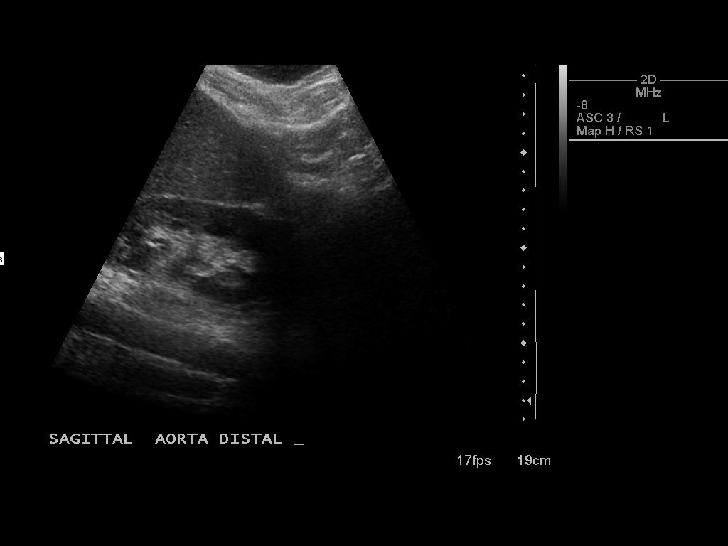
[im 23/37]
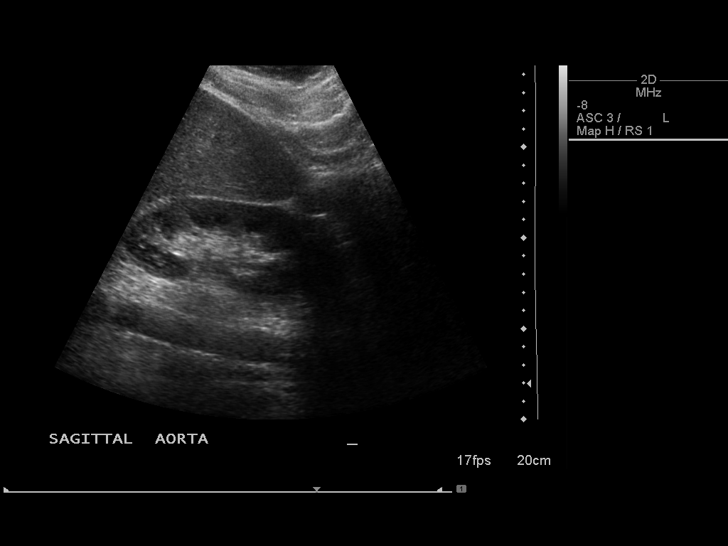
[im 25/37]
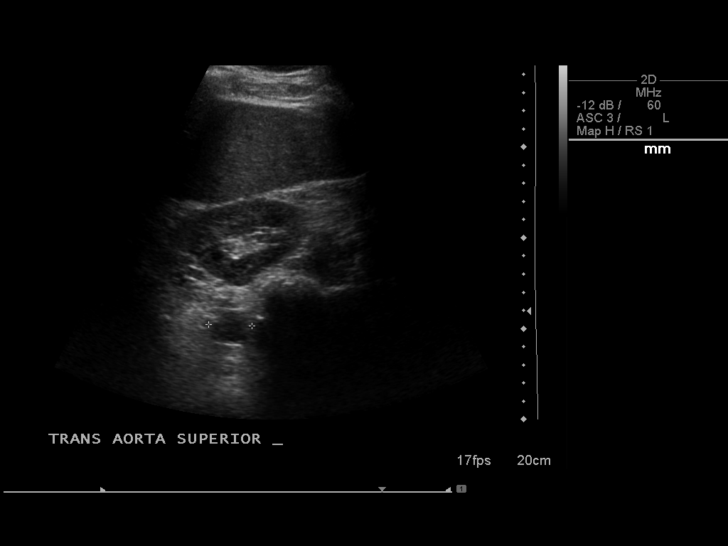
[im 28/37]
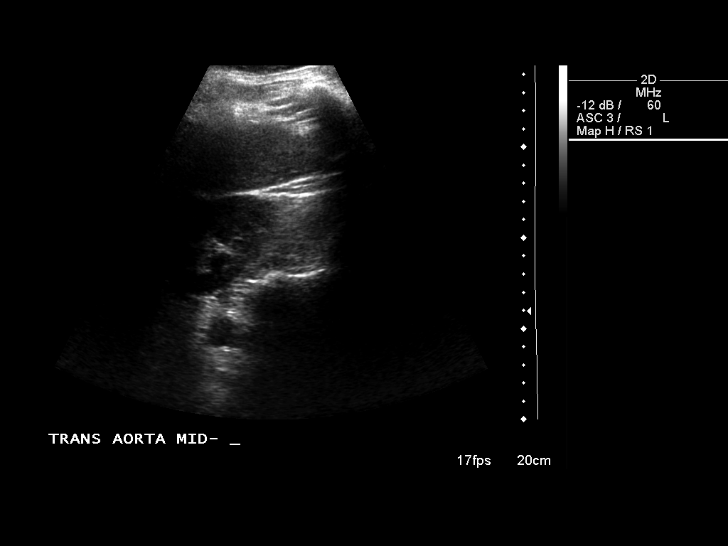
[im 31/37]
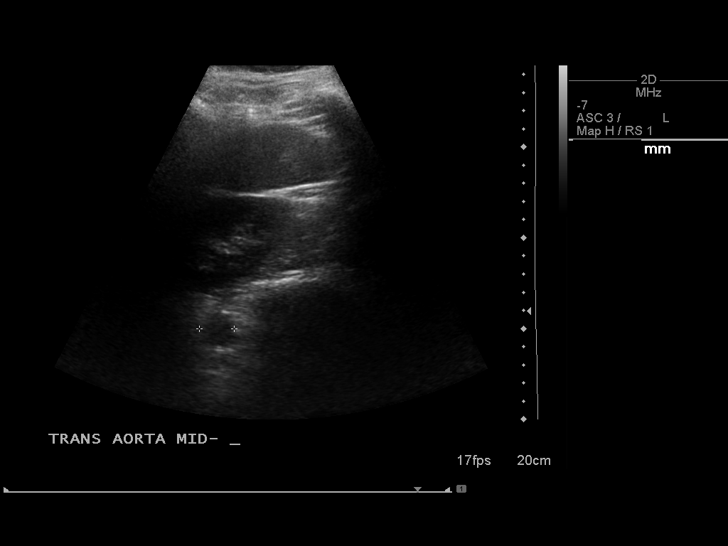
[im 34/37]
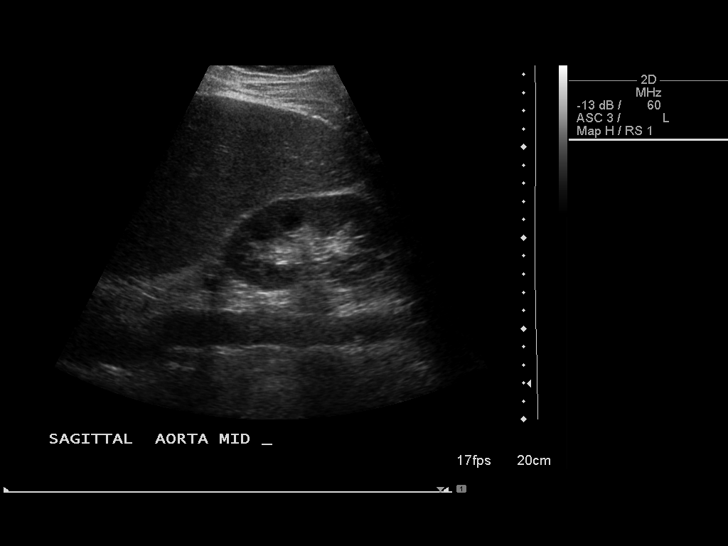
[im 37/37]
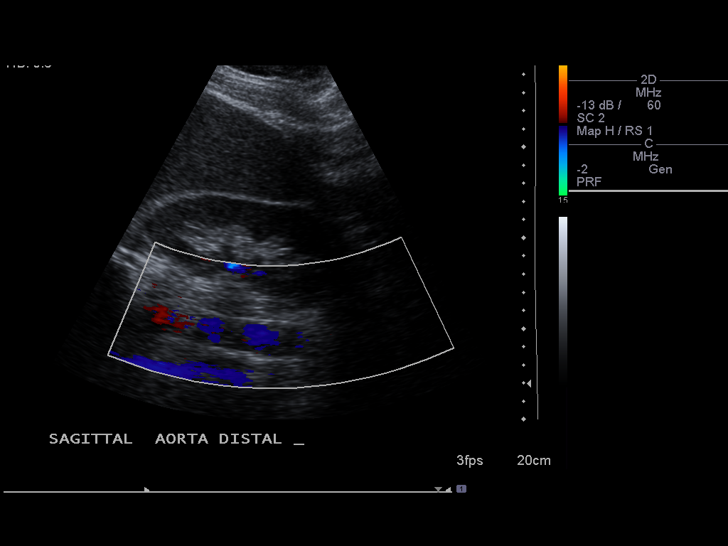

[14 of 25 positions shown; findings below may reference images not displayed]

Abdominal Aorta:  No aneurysm identified.

      Maximum AP diameter:  2.7 cm.
      Maximum TRV diameter:  2.4 cm.

Incidentally noted are gallstones.  See CT abdomen pelvis report
for further discussion.
IMPRESSION: No abdominal aortic aneurysm identified.

Gallstones.

## 2014-10-06 ENCOUNTER — Encounter (HOSPITAL_COMMUNITY): Payer: Self-pay | Admitting: Emergency Medicine

## 2014-10-06 ENCOUNTER — Emergency Department (HOSPITAL_COMMUNITY)
Admission: EM | Admit: 2014-10-06 | Discharge: 2014-10-07 | Disposition: A | Discharge status: Home / Self Care | Payer: Medicare (Managed Care) | Attending: Emergency Medicine | Admitting: Emergency Medicine

## 2014-10-06 DIAGNOSIS — Z951 Presence of aortocoronary bypass graft: Secondary | ICD-10-CM | POA: Diagnosis not present

## 2014-10-06 DIAGNOSIS — E785 Hyperlipidemia, unspecified: Secondary | ICD-10-CM | POA: Diagnosis not present

## 2014-10-06 DIAGNOSIS — R319 Hematuria, unspecified: Secondary | ICD-10-CM | POA: Diagnosis present

## 2014-10-06 DIAGNOSIS — F329 Major depressive disorder, single episode, unspecified: Secondary | ICD-10-CM | POA: Insufficient documentation

## 2014-10-06 DIAGNOSIS — Z794 Long term (current) use of insulin: Secondary | ICD-10-CM | POA: Diagnosis not present

## 2014-10-06 DIAGNOSIS — I10 Essential (primary) hypertension: Secondary | ICD-10-CM | POA: Insufficient documentation

## 2014-10-06 DIAGNOSIS — Z79899 Other long term (current) drug therapy: Secondary | ICD-10-CM | POA: Diagnosis not present

## 2014-10-06 DIAGNOSIS — K219 Gastro-esophageal reflux disease without esophagitis: Secondary | ICD-10-CM | POA: Diagnosis not present

## 2014-10-06 DIAGNOSIS — E119 Type 2 diabetes mellitus without complications: Secondary | ICD-10-CM | POA: Insufficient documentation

## 2014-10-06 HISTORY — DX: Contact with and (suspected) exposure to other hazardous, chiefly nonmedicinal, chemicals: Z77.098

## 2014-10-06 LAB — COMPREHENSIVE METABOLIC PANEL
ALBUMIN: 2.9 g/dL — AB (ref 3.5–5.2)
ALK PHOS: 90 U/L (ref 39–117)
ALT: 26 U/L (ref 0–53)
AST: 43 U/L — AB (ref 0–37)
Anion gap: 8 (ref 5–15)
BILIRUBIN TOTAL: 1.5 mg/dL — AB (ref 0.3–1.2)
BUN: 7 mg/dL (ref 6–23)
CHLORIDE: 100 meq/L (ref 96–112)
CO2: 25 mEq/L (ref 19–32)
Calcium: 9 mg/dL (ref 8.4–10.5)
Creatinine, Ser: 0.92 mg/dL (ref 0.50–1.35)
GFR calc Af Amer: >90 mL/min (ref >90)
GFR calc non Af Amer: 87 mL/min — ABNORMAL LOW (ref >90)
Glucose, Bld: 206 mg/dL — ABNORMAL HIGH (ref 70–99)
POTASSIUM: 4.3 meq/L (ref 3.7–5.3)
SODIUM: 133 meq/L — AB (ref 137–147)
TOTAL PROTEIN: 6.9 g/dL (ref 6.0–8.3)

## 2014-10-06 LAB — CBC WITH DIFFERENTIAL/PLATELET
BASOS ABS: 0 10*3/uL (ref 0.0–0.1)
BASOS PCT: 0 % (ref 0–1)
EOS ABS: 0.1 10*3/uL (ref 0.0–0.7)
Eosinophils Relative: 3 % (ref 0–5)
HCT: 37.6 % — ABNORMAL LOW (ref 39.0–52.0)
HEMOGLOBIN: 13.6 g/dL (ref 13.0–17.0)
Lymphocytes Relative: 29 % (ref 12–46)
Lymphs Abs: 1.1 10*3/uL (ref 0.7–4.0)
MCH: 31.9 pg (ref 26.0–34.0)
MCHC: 36.2 g/dL — AB (ref 30.0–36.0)
MCV: 88.1 fL (ref 78.0–100.0)
Monocytes Absolute: 0.2 10*3/uL (ref 0.1–1.0)
Monocytes Relative: 6 % (ref 3–12)
NEUTROS ABS: 2.2 10*3/uL (ref 1.7–7.7)
NEUTROS PCT: 61 % (ref 43–77)
PLATELETS: 29 10*3/uL — AB (ref 150–400)
RBC: 4.27 MIL/uL (ref 4.22–5.81)
RDW: 13.4 % (ref 11.5–15.5)
WBC: 3.7 10*3/uL — ABNORMAL LOW (ref 4.0–10.5)

## 2014-10-06 NOTE — ED Provider Notes (Signed)
CSN: [id number]     Arrival date & time 10/06/14  2058 History   First MD Initiated Contact with Patient 10/06/14 2313     Chief Complaint  Patient presents with  . Hematuria     (Consider location/radiation/quality/duration/timing/severity/associated sxs/prior Treatment) HPI Comments: Patient presents to the ED with a chief complaint of hematuria.  He states that yesterday he noticed dark brown urine, which changed to bloody urine today.  He reports a history of low platelets. He complains of some mild discomfort in his lower abdomen.  He had not tried taking anything to alleviate his symptoms.  He denies fevers, chills, nausea, vomiting, or diarrhea.  Denies dysuria, only hematuria.  The history is provided by the patient. No language interpreter was used.    Past Medical History  Diagnosis Date  . Depression   . Diabetes mellitus   . GERD (gastroesophageal reflux disease)   . Hypertension   . Hyperlipidemia   . Agent orange exposure    Past Surgical History  Procedure Laterality Date  . Bypass graft  05-24-08    triple dr [place] Cranekon --[facility]  . Finger surgery  02/27/2011    amputated left ring finger  . Coronary artery bypass graft     Family History  Problem Relation Age of Onset  . Diabetes Mother   . Heart disease Mother     chf  . Cancer Brother [id number]    esopageal   History  Substance Use Topics  . Smoking status: Never Smoker   . Smokeless tobacco: Never Used  . Alcohol Use: No    Review of Systems  Constitutional: Negative for fever and chills.  Respiratory: Negative for shortness of breath.   Cardiovascular: Negative for chest pain.  Gastrointestinal: Negative for nausea, vomiting, diarrhea and constipation.  Genitourinary: Positive for hematuria. Negative for dysuria.  All other systems reviewed and are negative.     Allergies  Review of patient's allergies indicates no known allergies.  Home Medications   Prior to Admission medications   Medication Sig  Start Date End Date Taking? Authorizing Provider  atorvastatin (LIPITOR) 80 MG tablet Take 40 mg by mouth every evening.    Historical Provider, MD  carvedilol (COREG) 6.25 MG tablet Take 6.25 mg by mouth 2 (two) times daily.    Historical Provider, MD  CINNAMON PO Take 1 capsule by mouth every morning.    Historical Provider, MD  furosemide (LASIX) 20 MG tablet Take 20 mg by mouth every morning.    Historical Provider, MD  HYDROcodone-acetaminophen (NORCO/VICODIN) 5-325 MG per tablet Take 1 tablet by mouth every 6 (six) hours as needed for pain. 06/12/13   Derwood KaplanAnkit Nanavati, MD  insulin glargine (LANTUS) 100 UNIT/ML injection Inject 10 Units into the skin at bedtime.    Historical Provider, MD  omeprazole (PRILOSEC) 20 MG capsule Take 20 mg by mouth every evening.    Historical Provider, MD  ondansetron (ZOFRAN ODT) 8 MG disintegrating tablet Take 1 tablet (8 mg total) by mouth every 8 (eight) hours as needed for nausea. 06/12/13   Derwood KaplanAnkit Nanavati, MD  spironolactone (ALDACTONE) 25 MG tablet Take 25 mg by mouth every morning.    Historical Provider, MD  tamsulosin (FLOMAX) 0.4 MG CAPS Take 1 capsule (0.4 mg total) by mouth daily. 06/12/13   Derwood KaplanAnkit Nanavati, MD  traZODone (DESYREL) 100 MG tablet Take 100 mg by mouth at bedtime.    Historical Provider, MD   BP 144/61  Pulse 80  Temp(Src) 98.1 F (36.7  C) (Oral)  Resp 18  Ht 5\' 10"  (1.778 m)  Wt 195 lb (88.451 kg)  BMI 27.98 kg/m2  SpO2 99% Physical Exam  Nursing note and vitals reviewed. Constitutional: He is oriented to person, place, and time. He appears well-developed and well-nourished.  HENT:  Head: Normocephalic and atraumatic.  Eyes: Conjunctivae and EOM are normal. Pupils are equal, round, and reactive to light. Right eye exhibits no discharge. Left eye exhibits no discharge. No scleral icterus.  Neck: Normal range of motion. Neck supple. No JVD present.  Cardiovascular: Normal rate, regular rhythm and normal heart sounds.  Exam reveals  no gallop and no friction rub.   No murmur heard. Pulmonary/Chest: Effort normal and breath sounds normal. No respiratory distress. He has no wheezes. He has no rales. He exhibits no tenderness.  Abdominal: Soft. He exhibits no distension and no mass. There is no tenderness. There is no rebound and no guarding.  Minimal LLQ tenderness to palpation, no other focal abdominal tendnerness  Musculoskeletal: Normal range of motion. He exhibits no edema and no tenderness.  Neurological: He is alert and oriented to person, place, and time.  Skin: Skin is warm and dry.  Psychiatric: He has a normal mood and affect. His behavior is normal. Judgment and thought content normal.    ED Course  Procedures (including critical care time) Results for orders placed during the hospital encounter of 10/06/14  CBC WITH DIFFERENTIAL      Result Value Ref Range   WBC 3.7 (*) 4.0 - 10.5 K/uL   RBC 4.27  4.22 - 5.81 MIL/uL   Hemoglobin 13.6  13.0 - 17.0 g/dL   HCT 21.3 (*) 08.6 - 57.8 %   MCV 88.1  78.0 - 100.0 fL   MCH 31.9  26.0 - 34.0 pg   MCHC 36.2 (*) 30.0 - 36.0 g/dL   RDW 46.9  62.9 - 52.8 %   Platelets 29 (*) 150 - 400 K/uL   Neutrophils Relative % 61  43 - 77 %   Neutro Abs 2.2  1.7 - 7.7 K/uL   Lymphocytes Relative 29  12 - 46 %   Lymphs Abs 1.1  0.7 - 4.0 K/uL   Monocytes Relative 6  3 - 12 %   Monocytes Absolute 0.2  0.1 - 1.0 K/uL   Eosinophils Relative 3  0 - 5 %   Eosinophils Absolute 0.1  0.0 - 0.7 K/uL   Basophils Relative 0  0 - 1 %   Basophils Absolute 0.0  0.0 - 0.1 K/uL  COMPREHENSIVE METABOLIC PANEL      Result Value Ref Range   Sodium 133 (*) 137 - 147 mEq/L   Potassium 4.3  3.7 - 5.3 mEq/L   Chloride 100  96 - 112 mEq/L   CO2 25  19 - 32 mEq/L   Glucose, Bld 206 (*) 70 - 99 mg/dL   BUN 7  6 - 23 mg/dL   Creatinine, Ser 4.13  0.50 - 1.35 mg/dL   Calcium 9.0  8.4 - 24.4 mg/dL   Total Protein 6.9  6.0 - 8.3 g/dL   Albumin 2.9 (*) 3.5 - 5.2 g/dL   AST 43 (*) 0 - 37 U/L   ALT  26  0 - 53 U/L   Alkaline Phosphatase 90  39 - 117 U/L   Total Bilirubin 1.5 (*) 0.3 - 1.2 mg/dL   GFR calc non Af Amer 87 (*) >90 mL/min   GFR calc Af Amer >90  >  90 mL/min   Anion gap 8  5 - 15  URINALYSIS, ROUTINE W REFLEX MICROSCOPIC      Result Value Ref Range   Color, Urine RED (*) YELLOW   APPearance TURBID (*) CLEAR   Specific Gravity, Urine 1.021  1.005 - 1.030   pH 6.5  5.0 - 8.0   Glucose, UA 250 (*) NEGATIVE mg/dL   Hgb urine dipstick LARGE (*) NEGATIVE   Bilirubin Urine SMALL (*) NEGATIVE   Ketones, ur 15 (*) NEGATIVE mg/dL   Protein, ur 161100 (*) NEGATIVE mg/dL   Urobilinogen, UA 1.0  0.0 - 1.0 mg/dL   Nitrite POSITIVE (*) NEGATIVE   Leukocytes, UA MODERATE (*) NEGATIVE  PROTIME-INR      Result Value Ref Range   Prothrombin Time 17.8 (*) 11.6 - 15.2 seconds   INR 1.45  0.00 - 1.49  URINE MICROSCOPIC-ADD ON      Result Value Ref Range   Squamous Epithelial / LPF FEW (*) RARE   WBC, UA 3-6  <3 WBC/hpf   RBC / HPF TOO NUMEROUS TO COUNT  <3 RBC/hpf   Bacteria, UA FEW (*) RARE      EKG Interpretation None      MDM   Final diagnoses:  Hematuria    Patient with hematuria.  Critically low platelets at 29, this is chronic.  UA remarkable for nitrite, but could be false positive.  Patient seen by and discussed with Dr. Preston FleetingGlick.  Urine culture pending.  Recommend urology follow-up.  Patient understands and agrees with the plan.    Roxy Horsemanobert Jaqlyn Gruenhagen, PA-C 10/07/14 [phone]

## 2014-10-06 NOTE — ED Notes (Signed)
Patient states that he is unable to give urine sample at this time. 

## 2014-10-07 LAB — PROTIME-INR
INR: 1.45 (ref 0.00–1.49)
Prothrombin Time: 17.8 seconds — ABNORMAL HIGH (ref 11.6–15.2)

## 2014-10-07 LAB — URINALYSIS, ROUTINE W REFLEX MICROSCOPIC
Glucose, UA: 250 mg/dL — AB
Ketones, ur: 15 mg/dL — AB
Nitrite: POSITIVE — AB
PH: 6.5 (ref 5.0–8.0)
PROTEIN: 100 mg/dL — AB
SPECIFIC GRAVITY, URINE: 1.021 (ref 1.005–1.030)
UROBILINOGEN UA: 1 mg/dL (ref 0.0–1.0)

## 2014-10-07 LAB — URINE MICROSCOPIC-ADD ON

## 2014-10-07 MED ORDER — CEPHALEXIN 250 MG PO CAPS
500.0000 mg | ORAL_CAPSULE | Freq: Once | ORAL | Status: AC
  Administered 2014-10-07: 500 mg via ORAL
  Filled 2014-10-07: qty 2

## 2014-10-07 MED ORDER — CEPHALEXIN 500 MG PO CAPS: 500.0000 mg | ORAL_CAPSULE | Freq: Four times a day (QID) | ORAL | Status: DC

## 2014-10-07 NOTE — ED Provider Notes (Signed)
A 63 year old male with history of thrombocytopenia comes in with three-day history of painless hematuria. No urinary urgency, frequency, tenesmus. No fever or chills. On exam, there is no CVA tenderness and no tenderness over the bladder. Platelet count has come back at 29,000 and this might be the cause of his hematuria, although he has had platelet counts lower than that in the past. Urinalysis is equivocal. There is a positive nitrate but I suspect that this is from color interference appeared generally 3-6 WBCs and few bacteria. He'll be given an empiric course of antibiotics and referred to urology for followup. Urine has been sent for culture.  Medical screening examination/treatment/procedure(s) were conducted as a shared visit with non-physician practitioner(s) and myself.  I personally evaluated the patient during the encounter.    Roger Boozeavid Arhan Mcmanamon, MD 10/07/14 [phone]

## 2014-10-07 NOTE — Discharge Instructions (Signed)

## 2014-10-08 LAB — URINE CULTURE
COLONY COUNT: NO GROWTH
CULTURE: NO GROWTH

## 2014-10-18 ENCOUNTER — Encounter: Payer: Self-pay | Admitting: Hematology & Oncology

## 2014-10-31 ENCOUNTER — Telehealth: Payer: Self-pay | Admitting: Hematology & Oncology

## 2014-10-31 NOTE — Telephone Encounter (Signed)
I spoke w NEW PATIENT today to remind them of their appointment with Dr. Myna HidalgoEnnever. Also, advised them to bring all medication bottles and insurance card information.  However, he [place].

## 2014-11-01 ENCOUNTER — Ambulatory Visit: Payer: Medicare Other

## 2014-11-01 ENCOUNTER — Ambulatory Visit (HOSPITAL_BASED_OUTPATIENT_CLINIC_OR_DEPARTMENT_OTHER): Payer: Medicare (Managed Care) | Admitting: Hematology & Oncology

## 2014-11-01 ENCOUNTER — Other Ambulatory Visit (HOSPITAL_BASED_OUTPATIENT_CLINIC_OR_DEPARTMENT_OTHER): Payer: Medicare Other | Admitting: Lab

## 2014-11-01 ENCOUNTER — Encounter: Payer: Self-pay | Admitting: Hematology & Oncology

## 2014-11-01 VITALS — BP 145/65 | HR 82 | Temp 97.9°F | Resp 18 | Ht 70.0 in | Wt 200.0 lb

## 2014-11-01 DIAGNOSIS — D696 Thrombocytopenia, unspecified: Secondary | ICD-10-CM | POA: Diagnosis not present

## 2014-11-01 DIAGNOSIS — D72819 Decreased white blood cell count, unspecified: Secondary | ICD-10-CM | POA: Diagnosis not present

## 2014-11-01 DIAGNOSIS — R161 Splenomegaly, not elsewhere classified: Secondary | ICD-10-CM

## 2014-11-01 DIAGNOSIS — E119 Type 2 diabetes mellitus without complications: Secondary | ICD-10-CM

## 2014-11-01 LAB — RETICULOCYTES (CHCC)
ABS Retic: 79.9 10*3/uL (ref 19.0–186.0)
RBC.: 4.44 MIL/uL (ref 4.22–5.81)
Retic Ct Pct: 1.8 % (ref 0.4–2.3)

## 2014-11-01 LAB — CBC WITH DIFFERENTIAL (CANCER CENTER ONLY)
BASO#: 0 10*3/uL (ref 0.0–0.2)
BASO%: 0.9 % (ref 0.0–2.0)
EOS ABS: 0.1 10*3/uL (ref 0.0–0.5)
EOS%: 2.1 % (ref 0.0–7.0)
HCT: 40.2 % (ref 38.7–49.9)
HGB: 14.4 g/dL (ref 13.0–17.1)
LYMPH#: 0.7 10*3/uL — AB (ref 0.9–3.3)
LYMPH%: 21.8 % (ref 14.0–48.0)
MCH: 32.8 pg (ref 28.0–33.4)
MCHC: 35.8 g/dL (ref 32.0–35.9)
MCV: 92 fL (ref 82–98)
MONO#: 0.2 10*3/uL (ref 0.1–0.9)
MONO%: 7 % (ref 0.0–13.0)
NEUT#: 2.3 10*3/uL (ref 1.5–6.5)
NEUT%: 68.2 % (ref 40.0–80.0)
Platelets: 23 10*3/uL — ABNORMAL LOW (ref 145–400)
RBC: 4.39 10*6/uL (ref 4.20–5.70)
RDW: 13.9 % (ref 11.1–15.7)
WBC: 3.3 10*3/uL — ABNORMAL LOW (ref 4.0–10.0)

## 2014-11-01 LAB — CHCC SATELLITE - SMEAR

## 2014-11-03 NOTE — Progress Notes (Signed)
Referral MD  Reason for Referral: leukopenia and thrombocytopenia  Chief Complaint  Patient presents with  . NEW PATIENT  : my blood is abnormal because I have cirrhosis  HPI: Roger [name] There are nice 63 year old gentleman. He is a [place] veteran. There is Agent Orange exposure. He has cirrhosis. He has splenomegaly.  He has issues with kidney stones. He has been seen by Dr. Lattie Haw of urology. He apparently is in need of treatment for the kidney stones. I am not sure if he needs surgery but it sounds like.  He has been seen [facility]. He appears had a bone marrow test done there. I assume that this was because of the leukopenia and thrombocytopenia.  He said he he recently had a CT scan done at the urologist office.  He's really had no problems with bleeding or bruising. He does have diabetes.  He has not had any surgery recently. He did have bypass surgery done at [facility] I think a couple years ago. At that time, his blood counts were abnormal. He did not have any bleeding during the bypass.  In a note from 2012, he was noted to have thrombocytopenia. It's mentioned that his platelet count was 29,000. This is back in April 2012.  He might be an evaluation for a liver transplant. I'm not sure exactly where this stands.  Again, he's not had any issues with bleeding or bruising.  With lab work he had done back in June of last year, his blood count was 21,000. His white cell count was 3.3. His hemoglobin was 13.4.  In October of thisyear, his white cell count 3.7, hemoglobin 13.6, and plate count 16,109. His MCV was 88. He had a normal white cell differential.  He is not a vegetarian. He has to watch his blood sugars.  There's been no weight loss or weight gain. He's had no swollen lymph nodes. He's had no rashes.  He's had no kind of foreign travel.  He is quite upset because his fianc would not him in with him today. This was too much for him to handle. It  sounds like he will break off the engagement because of this. I talked to him at length over this. Maybe she has some other reason for not being able to be with him right now.  Overall, his performance status is ECOG 0.           Past Medical History  Diagnosis Date  . Depression   . Diabetes mellitus   . GERD (gastroesophageal reflux disease)   . Hypertension   . Hyperlipidemia   . Agent orange exposure   :  Past Surgical History  Procedure Laterality Date  . Bypass graft  05-24-08    triple dr [place] Crane --[facility]  . Finger surgery  02/27/2011    amputated left ring finger  . Coronary artery bypass graft    :  Current outpatient prescriptions: carvedilol (COREG) 6.25 MG tablet, Take 6.25 mg by mouth 2 (two) times daily., Disp: , Rfl: ;  CINNAMON PO, Take 1 capsule by mouth every morning., Disp: , Rfl: ;  furosemide (LASIX) 20 MG tablet, Take 20 mg by mouth every morning., Disp: , Rfl: ;  insulin glargine (LANTUS) 100 UNIT/ML injection, Inject 10 Units into the skin at bedtime., Disp: , Rfl:  omeprazole (PRILOSEC) 20 MG capsule, Take 20 mg by mouth every evening., Disp: , Rfl: ;  traZODone (DESYREL) 100 MG tablet, Take 100 mg by mouth  at bedtime., Disp: , Rfl: :  :  No Known Allergies:  Family History  Problem Relation Age of Onset  . Diabetes Mother   . Heart disease Mother     chf  . Cancer Brother [id number]    esopageal  :  History   Social History  . Marital Status: Widowed    Spouse Name: N/A    Number of Children: N/A  . Years of Education: N/A   Occupational History  . [employer]    Social History Main Topics  . Smoking status: Never Smoker   . Smokeless tobacco: Never Used     Comment: NEVER SMOKED CIGARETTES  . Alcohol Use: No  . Drug Use: Not on file  . Sexual Activity: Not on file   Other Topics Concern  . Not on file   Social History Narrative  :  Pertinent items are noted in HPI.  Exam: @IPVITALS @ Well-developed and  well-nourished white gentleman in no obvious distress. Vital signs show a temperature of 97.9. Pulse 82. Blood pressure 145/65. Weight is about 200 pounds. Head and neck exam shows no ocular or oral lesions. There is no scleral icterus. There is no adenopathy in the neck. Lungs are clear. Cardiac exam regular rate and rhythm with no murmurs, rubs or bruits. Abdomen is soft. He has good bowel sounds. There is no fluid wave. There is no palpable abdominal mass. There is no palpable liver edge. His spleen tip is just below the left costal margin. Back exam shows no tenderness over the spine, ribs or hips. Extremities shows no clubbing, cyanosis or edema. Has good range of motion of his joints. He has good strength. Skin exam shows no rashes, ecchymoses or petechia. Neurological exam is nonfocal.   Recent Labs  11/01/14 1442  WBC 3.3*  HGB 14.4  HCT 40.2  PLT 23 Platelet count consistent in citrate*   No results for input(s): NA, K, CL, CO2, GLUCOSE, BUN, CREATININE, CALCIUM in the last 72 hours.  Blood smear review: normochromic and normocytic population of red blood cells. There are no nucleated red blood cells. I see no teardrop cells. There is no schistocytes or spherocytes. White cells are decreased in number. He has good maturation was myeloid series. He has no atypical lymphocytes. There is no hypersegmented polys. I see no blasts. Platelets are markedly decreased in number. Platelets are well granulated. He has several large platelets.  Pathology:none    Assessment and Plan: [name] is a 63 year old gentleman with leukopenia and thrombocytopenia. He has cirrhosis. He is negative for hepatitis B and hepatitis C. I suspect the cirrhosis is from his diabetes.  I think that the splenomegaly isn't the source of his leukopenia and thrombocytopenia. I explained to him that the spleen is a big lymph node and when it is large it acts like a "sponge" and  Can trap the platelets and white cells.  Unfortunate, the spleen cannot be taken out. This would worsen his hepatic issues.  His blood smear certainly is consistent with splenomegaly.  It will be very interesting to see the [facility]. I will have to see if we can't get that result.  I also need to get the CT scan report that he had done recently.  I would be more worried with respect him bleeding from a urological procedure with coagulopathy if his PT and PTT were elevated. I would think that bleeding from thrombocytopenia would not be too much  of a problem.  I spent about 45 minutes with him. He certainly is aware of the problem. This is not a new problem for him. He has had surgery before and has gone through major cardiac surgery despite the thrombocytopenia.  We will have to see if we need to get him back. Hopefully, we will not.  I will again, have to try to get the bone marrow report. I also want to get the CT report.

## 2014-11-04 ENCOUNTER — Encounter: Payer: Self-pay | Admitting: Hematology & Oncology

## 2014-11-04 ENCOUNTER — Encounter: Payer: Self-pay | Admitting: *Deleted

## 2014-11-18 ENCOUNTER — Other Ambulatory Visit: Payer: BC Managed Care – PPO | Admitting: Lab

## 2014-11-18 ENCOUNTER — Ambulatory Visit: Payer: BC Managed Care – PPO

## 2014-11-18 ENCOUNTER — Ambulatory Visit: Payer: BC Managed Care – PPO | Admitting: Hematology & Oncology

## 2014-11-28 ENCOUNTER — Other Ambulatory Visit: Payer: Self-pay | Admitting: Urology

## 2014-12-10 NOTE — Progress Notes (Signed)
CT abd and pelvis 10/21/2014 epic

## 2014-12-16 ENCOUNTER — Inpatient Hospital Stay (HOSPITAL_COMMUNITY)
Admission: RE | Admit: 2014-12-16 | Discharge: 2014-12-16 | Disposition: A | Discharge status: Home / Self Care | Payer: Non-veteran care | Source: Ambulatory Visit

## 2014-12-18 ENCOUNTER — Encounter (HOSPITAL_COMMUNITY)
Admission: RE | Admit: 2014-12-18 | Discharge: 2014-12-18 | Disposition: A | Discharge status: Home / Self Care | Payer: Medicare (Managed Care) | Source: Ambulatory Visit | Attending: Urology | Admitting: Urology

## 2014-12-18 ENCOUNTER — Encounter (HOSPITAL_COMMUNITY): Payer: Self-pay

## 2014-12-18 ENCOUNTER — Ambulatory Visit (HOSPITAL_COMMUNITY)
Admission: RE | Admit: 2014-12-18 | Discharge: 2014-12-18 | Disposition: A | Discharge status: Home / Self Care | Payer: Medicare (Managed Care) | Source: Ambulatory Visit | Attending: Anesthesiology | Admitting: Anesthesiology

## 2014-12-18 DIAGNOSIS — D696 Thrombocytopenia, unspecified: Secondary | ICD-10-CM | POA: Insufficient documentation

## 2014-12-18 DIAGNOSIS — N200 Calculus of kidney: Secondary | ICD-10-CM | POA: Diagnosis not present

## 2014-12-18 DIAGNOSIS — R161 Splenomegaly, not elsewhere classified: Secondary | ICD-10-CM | POA: Diagnosis not present

## 2014-12-18 DIAGNOSIS — Z01818 Encounter for other preprocedural examination: Secondary | ICD-10-CM | POA: Diagnosis present

## 2014-12-18 DIAGNOSIS — D72819 Decreased white blood cell count, unspecified: Secondary | ICD-10-CM | POA: Insufficient documentation

## 2014-12-18 DIAGNOSIS — I10 Essential (primary) hypertension: Secondary | ICD-10-CM | POA: Insufficient documentation

## 2014-12-18 DIAGNOSIS — E119 Type 2 diabetes mellitus without complications: Secondary | ICD-10-CM | POA: Insufficient documentation

## 2014-12-18 DIAGNOSIS — Z87898 Personal history of other specified conditions: Secondary | ICD-10-CM | POA: Insufficient documentation

## 2014-12-18 HISTORY — DX: Unspecified abdominal hernia without obstruction or gangrene: K46.9

## 2014-12-18 HISTORY — DX: Thrombocytopenia, unspecified: D69.6

## 2014-12-18 HISTORY — DX: Personal history of urinary calculi: Z87.442

## 2014-12-18 HISTORY — DX: Fatty (change of) liver, not elsewhere classified: K76.0

## 2014-12-18 HISTORY — DX: Unspecified osteoarthritis, unspecified site: M19.90

## 2014-12-18 HISTORY — DX: Splenomegaly, not elsewhere classified: R16.1

## 2014-12-18 LAB — COMPREHENSIVE METABOLIC PANEL
ALBUMIN: 2.9 g/dL — AB (ref 3.5–5.2)
ALT: 22 U/L (ref 0–53)
AST: 35 U/L (ref 0–37)
Alkaline Phosphatase: 96 U/L (ref 39–117)
Anion gap: 4 — ABNORMAL LOW (ref 5–15)
BILIRUBIN TOTAL: 1.7 mg/dL — AB (ref 0.3–1.2)
BUN: 10 mg/dL (ref 6–23)
CHLORIDE: 100 meq/L (ref 96–112)
CO2: 27 mmol/L (ref 19–32)
Calcium: 8 mg/dL — ABNORMAL LOW (ref 8.4–10.5)
Creatinine, Ser: 0.92 mg/dL (ref 0.50–1.35)
GFR calc Af Amer: >90 mL/min (ref >90)
GFR calc non Af Amer: 87 mL/min — ABNORMAL LOW (ref >90)
Glucose, Bld: 385 mg/dL — ABNORMAL HIGH (ref 70–99)
Potassium: 3.8 mmol/L (ref 3.5–5.1)
SODIUM: 131 mmol/L — AB (ref 135–145)
TOTAL PROTEIN: 6.1 g/dL (ref 6.0–8.3)

## 2014-12-18 LAB — CBC
HEMATOCRIT: 34.5 % — AB (ref 39.0–52.0)
Hemoglobin: 11.7 g/dL — ABNORMAL LOW (ref 13.0–17.0)
MCH: 31.8 pg (ref 26.0–34.0)
MCHC: 33.9 g/dL (ref 30.0–36.0)
MCV: 93.8 fL (ref 78.0–100.0)
Platelets: 18 10*3/uL — CL (ref 150–400)
RBC: 3.68 MIL/uL — ABNORMAL LOW (ref 4.22–5.81)
RDW: 14 % (ref 11.5–15.5)
WBC: 2.5 10*3/uL — ABNORMAL LOW (ref 4.0–10.5)

## 2014-12-18 NOTE — Progress Notes (Signed)
CRITICAL VALUE ALERT  Critical value received:  Platelet count   Date of notification:  12/18/2014   Time of notification:  1650  Critical value read back  Yes   Nurse who received alert: na   MD notified (1st page): Dr Sherron MondayMacDiarmid   Time of first page:  1650  MD notified (2nd page): n/a   Time of second page:n/a   Responding MD:  Dr Sherron MondayMacDiarmid   Time MD responded:  [phone]

## 2014-12-18 NOTE — Progress Notes (Signed)
CMP results done 12/18/2014 faxed via EPIC to Dr Retta Dionesahlstedt.

## 2014-12-18 NOTE — Progress Notes (Addendum)
Watched for final platelet count result on 12/18/2014 when white count was 2.5 at 1550pm.    At 1640 pm platelet count results  at 18 on lab results in EPIC.  Called office of Dr Retta Dionesahlstedt and asked to speak to MD or nurse to report critical lab results.  Staff stated Dr Retta Dionesahlstedt not answering page and office gone for the day at 1645pm.  Dr Sherron MondayMacDiarmid on call- [phone] pager .  Paged Dr Sherron MondayMacDiarmid at [phone]  At approximately 1645pm.    Dr Sherron MondayMacDiarmid returned page at approximately 1650pm.  Reported platelet count of 18 white count of 2.5 and glucose of 385.  Patient with history of agent orange exposure , decreased platelet count and decreased white count and diabetes and splenomegaly.  Dr Sherron MondayMacDiarmid stated that he would handle and thanked me for the notification.

## 2014-12-18 NOTE — Patient Instructions (Addendum)
Roger [name]  12/18/2014   Your procedure is scheduled on: Monday 12/23/14  [facility]  Entr[facility] and follow signs to               Short Stay Center at 05:30 AM.  Call this number if you have problems the morning of surgery [phone]   Remember:  Do not eat food or drink liquids :After Midnight.    Please take half dose of Lantus on the night of 12/22/14.  Take these medicines the morning of surgery with A SIP OF WATER: coreg                               You may not have any metal on your body including hair pins and              piercings  Do not wear jewelry, make-up, lotions, powders or perfumes.             Do not wear nail polish.  Do not shave  48 hours prior to surgery.              Men may shave face and neck.  Do not bring valuables to the hospital. [facility] IS NOT             RESPONSIBLE   FOR VALUABLES.  Contacts, dentures or bridgework may not be worn into surgery.  Leave suitcase in the car. After surgery it may be brought to your room.    _____________________________________________________________________             [facility] - Preparing for Surgery Before surgery, you can play an important role.  Because skin is not sterile, your skin needs to be as free of germs as possible.  You can reduce the number of germs on your skin by washing with CHG (chlorahexidine gluconate) soap before surgery.  CHG is an antiseptic cleaner which kills germs and bonds with the skin to continue killing germs even after washing. Please DO NOT use if you have an allergy to CHG or antibacterial soaps.  If your skin becomes reddened/irritated stop using the CHG and inform your nurse when you arrive at Short Stay. Do not shave (including legs and underarms) for at least 48 hours prior to the first CHG shower.  You may shave your face/neck. Please follow these instructions carefully:  1.  Shower with CHG Soap the night before surgery and the   morning of Surgery.  2.  If you choose to wash your hair, wash your hair first as usual with your  normal  shampoo.  3.  After you shampoo, rinse your hair and body thoroughly to remove the  shampoo.                            4.  Use CHG as you would any other liquid soap.  You can apply chg directly  to the skin and wash                       Gently with a scrungie or clean washcloth.  5.  Apply the CHG Soap to your body ONLY FROM THE NECK DOWN.   Do not use on face/ open  Wound or open sores. Avoid contact with eyes, ears mouth and genitals (private parts).                       Wash face,  Genitals (private parts) with your normal soap.             6.  Wash thoroughly, paying special attention to the area where your surgery  will be performed.  7.  Thoroughly rinse your body with warm water from the neck down.  8.  DO NOT shower/wash with your normal soap after using and rinsing off  the CHG Soap.                9.  Pat yourself dry with a clean towel.            10.  Wear clean pajamas.            11.  Place clean sheets on your bed the night of your first shower and do not  sleep with pets. Day of Surgery : Do not apply any lotions/deodorants the morning of surgery.  Please wear clean clothes to the hospital/surgery center.  FAILURE TO FOLLOW THESE INSTRUCTIONS MAY RESULT IN THE CANCELLATION OF YOUR SURGERY PATIENT SIGNATURE_________________________________  NURSE SIGNATURE__________________________________  ________________________________________________________________________  WHAT IS A BLOOD TRANSFUSION? Blood Transfusion Information  A transfusion is the replacement of blood or some of its parts. Blood is made up of multiple cells which provide different functions.  Red blood cells carry oxygen and are used for blood loss replacement.  White blood cells fight against infection.  Platelets control bleeding.  Plasma helps clot blood.  Other blood  products are available for specialized needs, such as hemophilia or other clotting disorders. BEFORE THE TRANSFUSION  Who gives blood for transfusions?   Healthy volunteers who are fully evaluated to make sure their blood is safe. This is blood bank blood. Transfusion therapy is the safest it has ever been in the practice of medicine. Before blood is taken from a donor, a complete history is taken to make sure that person has no history of diseases nor engages in risky social behavior (examples are intravenous drug use or sexual activity with multiple partners). The donor's travel history is screened to minimize risk of transmitting infections, such as malaria. The donated blood is tested for signs of infectious diseases, such as HIV and hepatitis. The blood is then tested to be sure it is compatible with you in order to minimize the chance of a transfusion reaction. If you or a relative donates blood, this is often done in anticipation of surgery and is not appropriate for emergency situations. It takes many days to process the donated blood. RISKS AND COMPLICATIONS Although transfusion therapy is very safe and saves many lives, the main dangers of transfusion include:   Getting an infectious disease.  Developing a transfusion reaction. This is an allergic reaction to something in the blood you were given. Every precaution is taken to prevent this. The decision to have a blood transfusion has been considered carefully by your caregiver before blood is given. Blood is not given unless the benefits outweigh the risks. AFTER THE TRANSFUSION  Right after receiving a blood transfusion, you will usually feel much better and more energetic. This is especially true if your red blood cells have gotten low (anemic). The transfusion raises the level of the red blood cells which carry oxygen, and this usually causes an energy increase.  The  nurse administering the transfusion will monitor you carefully for  complications. HOME CARE INSTRUCTIONS  No special instructions are needed after a transfusion. You may find your energy is better. Speak with your caregiver about any limitations on activity for underlying diseases you may have. SEEK MEDICAL CARE IF:   Your condition is not improving after your transfusion.  You develop redness or irritation at the intravenous (IV) site. SEEK IMMEDIATE MEDICAL CARE IF:  Any of the following symptoms occur over the next 12 hours:  Shaking chills.  You have a temperature by mouth above 102 F (38.9 C), not controlled by medicine.  Chest, back, or muscle pain.  People around you feel you are not acting correctly or are confused.  Shortness of breath or difficulty breathing.  Dizziness and fainting.  You get a rash or develop hives.  You have a decrease in urine output.  Your urine turns a dark color or changes to pink, red, or brown. Any of the following symptoms occur over the next 10 days:  You have a temperature by mouth above 102 F (38.9 C), not controlled by medicine.  Shortness of breath.  Weakness after normal activity.  The white part of the eye turns yellow (jaundice).  You have a decrease in the amount of urine or are urinating less often.  Your urine turns a dark color or changes to pink, red, or brown. Document Released: 12/03/2000 Document Revised: 02/28/2012 Document Reviewed: 07/22/2008 [facility] Patient Information 2014 [place], [place].  _______________________________________________________________________

## 2014-12-18 NOTE — Progress Notes (Signed)
EKG from 12/12/2014 on chart.

## 2014-12-19 NOTE — Progress Notes (Signed)
Called office of [place] Viona GilmoreMary [name] [place] [facility] [place] and they have received Medical Records Request sent this am.

## 2014-12-19 NOTE — Progress Notes (Signed)
Left message for patient on [phone] and asked who current cardiologist is.  Asked patient to call back with info on message.

## 2014-12-19 NOTE — Progress Notes (Signed)
Called office of Dr Retta Dionesahlstedt and spoke with his nurse at office.  Dr Retta Dionesahlstedt is still at office and will be made aware of the following by his nurse: 1.  Please see note faxed via EPIC regarding Dr Quinn AxeEwell's comment on patient.   2.  I have not yet received anything from PCP office of Dr Viona GilmoreMary Mason from patient and they have received fax ( I called them to verify).   3.  Patient called me back and I asked patient how long it had been since seen by a cardiologist- patient's reply was 6 years ? And unsure of cardiologist name and to call me back with name of cardiologist.

## 2014-12-19 NOTE — Progress Notes (Signed)
Patient called back and stated he has not seen cardiologist in 6 years.  MD in [facility] unsure of name.  Patient to call me back with name of cardiologist in [facility] [place].  PCP- [place]Mary [place]

## 2014-12-19 NOTE — Progress Notes (Signed)
Requested info from [facility] and received info  regarding ED visit 12/12/2014.  Called and spoke with Dr Retta Dionesahlstedt and faxed him all info from [facility] .  Fax confirmation received.  Requested by fax LOV note from Dr Viona GilmoreMary Mason who is listed as PCP on ED info from [facility].

## 2014-12-19 NOTE — Progress Notes (Signed)
Received info from PCP office.  Faxed info to Dr Retta Dionesahlstedt and received confirmation of fax.

## 2014-12-19 NOTE — Progress Notes (Signed)
Dr Leta JunglingEwell aware of patient medical history and lab results from 12/18/2014  Along with ED visit of 12/24 2015 at [facility] .  Dr Leta JunglingEwell stated patient needs " medical tuneup " prior to surgery.

## 2014-12-23 ENCOUNTER — Ambulatory Visit (HOSPITAL_COMMUNITY): Admission: RE | Admit: 2014-12-23 | Payer: Medicare (Managed Care) | Source: Ambulatory Visit | Admitting: Urology

## 2014-12-23 ENCOUNTER — Encounter (HOSPITAL_COMMUNITY): Admission: RE | Payer: Self-pay | Source: Ambulatory Visit

## 2014-12-23 LAB — TYPE AND SCREEN
ABO/RH(D): A POS
ANTIBODY SCREEN: NEGATIVE

## 2014-12-23 SURGERY — CYSTOSCOPY/RETROGRADE/URETEROSCOPY
Anesthesia: General | Laterality: Right

## 2015-01-22 ENCOUNTER — Other Ambulatory Visit: Payer: Self-pay | Admitting: Urology

## 2015-01-27 ENCOUNTER — Encounter (HOSPITAL_COMMUNITY): Payer: Self-pay | Admitting: *Deleted

## 2015-01-27 NOTE — Progress Notes (Signed)
Called requested orders be released in Epic to sign and held surgery 02-03-15 for Same day surgery Thanks

## 2015-02-01 NOTE — Anesthesia Preprocedure Evaluation (Signed)
Anesthesia Evaluation  Patient identified by MRN, date of birth, ID band Patient awake    Reviewed: Allergy & Precautions, NPO status , Patient's Chart, lab work & pertinent test results  History of Anesthesia Complications Negative for: history of anesthetic complications  Airway Mallampati: II  TM Distance: >3 FB Neck ROM: Full    Dental no notable dental hx. (+) Dental Advisory Given   Pulmonary neg pulmonary ROS,  breath sounds clear to auscultation  Pulmonary exam normal       Cardiovascular hypertension, Pt. on medications and Pt. on home beta blockers + CAD and + CABG Rhythm:Regular Rate:Normal     Neuro/Psych PSYCHIATRIC DISORDERS Anxiety Depression negative neurological ROS     GI/Hepatic negative GI ROS, GERD-  Medicated and Controlled,(+) Cirrhosis -      ,   Endo/Other  diabetes, Type 2, Insulin Dependent  Renal/GU negative Renal ROS  negative genitourinary   Musculoskeletal  (+) Arthritis -, Osteoarthritis,    Abdominal   Peds negative pediatric ROS (+)  Hematology Thrombocytopenia, followed by heme/onc, aware of urology procedure   Anesthesia Other Findings   Reproductive/Obstetrics negative OB ROS                             Anesthesia Physical Anesthesia Plan  ASA: III  Anesthesia Plan: General   Post-op Pain Management:    Induction: Intravenous  Airway Management Planned: LMA  Additional Equipment:   Intra-op Plan:   Post-operative Plan: Extubation in OR  Informed Consent: I have reviewed the patients History and Physical, chart, labs and discussed the procedure including the risks, benefits and alternatives for the proposed anesthesia with the patient or authorized representative who has indicated his/her understanding and acceptance.   Dental advisory given  Plan Discussed with: CRNA  Anesthesia Plan Comments:         Anesthesia Quick  Evaluation

## 2015-02-03 ENCOUNTER — Ambulatory Visit (HOSPITAL_COMMUNITY): Payer: Medicare Other | Admitting: Anesthesiology

## 2015-02-03 ENCOUNTER — Observation Stay (HOSPITAL_COMMUNITY)
Admission: RE | Admit: 2015-02-03 | Discharge: 2015-02-03 | Disposition: A | Discharge status: Home / Self Care | Payer: Medicare Other | Source: Ambulatory Visit | Attending: Urology | Admitting: Urology

## 2015-02-03 ENCOUNTER — Encounter (HOSPITAL_COMMUNITY)
Admission: RE | Disposition: A | Discharge status: Home / Self Care | Payer: Self-pay | Source: Ambulatory Visit | Attending: Urology

## 2015-02-03 ENCOUNTER — Encounter (HOSPITAL_COMMUNITY): Payer: Self-pay | Admitting: *Deleted

## 2015-02-03 DIAGNOSIS — Z794 Long term (current) use of insulin: Secondary | ICD-10-CM | POA: Insufficient documentation

## 2015-02-03 DIAGNOSIS — N200 Calculus of kidney: Principal | ICD-10-CM | POA: Insufficient documentation

## 2015-02-03 DIAGNOSIS — I2510 Atherosclerotic heart disease of native coronary artery without angina pectoris: Secondary | ICD-10-CM | POA: Diagnosis not present

## 2015-02-03 DIAGNOSIS — I10 Essential (primary) hypertension: Secondary | ICD-10-CM | POA: Diagnosis not present

## 2015-02-03 DIAGNOSIS — D696 Thrombocytopenia, unspecified: Secondary | ICD-10-CM | POA: Diagnosis not present

## 2015-02-03 DIAGNOSIS — F329 Major depressive disorder, single episode, unspecified: Secondary | ICD-10-CM | POA: Insufficient documentation

## 2015-02-03 DIAGNOSIS — Z951 Presence of aortocoronary bypass graft: Secondary | ICD-10-CM | POA: Diagnosis not present

## 2015-02-03 DIAGNOSIS — E119 Type 2 diabetes mellitus without complications: Secondary | ICD-10-CM | POA: Insufficient documentation

## 2015-02-03 DIAGNOSIS — I509 Heart failure, unspecified: Secondary | ICD-10-CM | POA: Insufficient documentation

## 2015-02-03 DIAGNOSIS — Z89022 Acquired absence of left finger(s): Secondary | ICD-10-CM | POA: Insufficient documentation

## 2015-02-03 DIAGNOSIS — K219 Gastro-esophageal reflux disease without esophagitis: Secondary | ICD-10-CM | POA: Diagnosis not present

## 2015-02-03 DIAGNOSIS — E785 Hyperlipidemia, unspecified: Secondary | ICD-10-CM | POA: Diagnosis not present

## 2015-02-03 DIAGNOSIS — R161 Splenomegaly, not elsewhere classified: Secondary | ICD-10-CM | POA: Insufficient documentation

## 2015-02-03 HISTORY — PX: HOLMIUM LASER APPLICATION: SHX5852

## 2015-02-03 HISTORY — PX: CYSTOSCOPY/RETROGRADE/URETEROSCOPY: SHX5316

## 2015-02-03 LAB — COMPREHENSIVE METABOLIC PANEL
ALK PHOS: 85 U/L (ref 39–117)
ALT: 35 U/L (ref 0–53)
ANION GAP: 7 (ref 5–15)
AST: 41 U/L — ABNORMAL HIGH (ref 0–37)
Albumin: 3.3 g/dL — ABNORMAL LOW (ref 3.5–5.2)
BILIRUBIN TOTAL: 2.9 mg/dL — AB (ref 0.3–1.2)
BUN: 14 mg/dL (ref 6–23)
CO2: 23 mmol/L (ref 19–32)
CREATININE: 0.92 mg/dL (ref 0.50–1.35)
Calcium: 8.8 mg/dL (ref 8.4–10.5)
Chloride: 104 mmol/L (ref 96–112)
GFR calc Af Amer: >90 mL/min (ref >90)
GFR, EST NON AFRICAN AMERICAN: 87 mL/min — AB (ref >90)
Glucose, Bld: 247 mg/dL — ABNORMAL HIGH (ref 70–99)
Potassium: 3.7 mmol/L (ref 3.5–5.1)
Sodium: 134 mmol/L — ABNORMAL LOW (ref 135–145)
Total Protein: 6.9 g/dL (ref 6.0–8.3)

## 2015-02-03 LAB — TYPE AND SCREEN
ABO/RH(D): A POS
Antibody Screen: NEGATIVE

## 2015-02-03 LAB — CBC
HCT: 38.4 % — ABNORMAL LOW (ref 39.0–52.0)
Hemoglobin: 13.5 g/dL (ref 13.0–17.0)
MCH: 32.3 pg (ref 26.0–34.0)
MCHC: 35.2 g/dL (ref 30.0–36.0)
MCV: 91.9 fL (ref 78.0–100.0)
PLATELETS: 35 10*3/uL — AB (ref 150–400)
RBC: 4.18 MIL/uL — ABNORMAL LOW (ref 4.22–5.81)
RDW: 14.7 % (ref 11.5–15.5)
WBC: 6 10*3/uL (ref 4.0–10.5)

## 2015-02-03 LAB — GLUCOSE, CAPILLARY: Glucose-Capillary: 246 mg/dL — ABNORMAL HIGH (ref 70–99)

## 2015-02-03 SURGERY — CYSTOSCOPY/RETROGRADE/URETEROSCOPY
Anesthesia: General | Laterality: Right

## 2015-02-03 MED ORDER — LIDOCAINE HCL (CARDIAC) 20 MG/ML IV SOLN
INTRAVENOUS | Status: DC | PRN
  Administered 2015-02-03: 100 mg via INTRAVENOUS

## 2015-02-03 MED ORDER — ONDANSETRON HCL 4 MG/2ML IJ SOLN
INTRAMUSCULAR | Status: DC | PRN
  Administered 2015-02-03: 4 mg via INTRAVENOUS

## 2015-02-03 MED ORDER — PROPOFOL 10 MG/ML IV BOLUS
INTRAVENOUS | Status: AC
  Filled 2015-02-03: qty 20

## 2015-02-03 MED ORDER — IOHEXOL 300 MG/ML  SOLN
INTRAMUSCULAR | Status: DC | PRN
  Administered 2015-02-03: 10 mL

## 2015-02-03 MED ORDER — CARVEDILOL 6.25 MG PO TABS
6.2500 mg | ORAL_TABLET | Freq: Two times a day (BID) | ORAL | Status: DC
  Administered 2015-02-03: 6.25 mg via ORAL
  Filled 2015-02-03 (×2): qty 1

## 2015-02-03 MED ORDER — ONDANSETRON HCL 4 MG/2ML IJ SOLN: 4.0000 mg | INTRAMUSCULAR | Status: DC | PRN

## 2015-02-03 MED ORDER — OXYBUTYNIN CHLORIDE 5 MG PO TABS: 5.0000 mg | ORAL_TABLET | Freq: Three times a day (TID) | ORAL | Status: DC | PRN

## 2015-02-03 MED ORDER — SODIUM CHLORIDE 0.45 % IV SOLN: INTRAVENOUS | Status: DC

## 2015-02-03 MED ORDER — EPHEDRINE SULFATE 50 MG/ML IJ SOLN
INTRAMUSCULAR | Status: AC
  Filled 2015-02-03: qty 1

## 2015-02-03 MED ORDER — TRAZODONE HCL 100 MG PO TABS
100.0000 mg | ORAL_TABLET | Freq: Every day | ORAL | Status: DC
  Filled 2015-02-03: qty 1

## 2015-02-03 MED ORDER — ONDANSETRON HCL 4 MG/2ML IJ SOLN: 4.0000 mg | Freq: Once | INTRAMUSCULAR | Status: DC | PRN

## 2015-02-03 MED ORDER — MIDAZOLAM HCL 2 MG/2ML IJ SOLN
INTRAMUSCULAR | Status: AC
  Filled 2015-02-03: qty 2

## 2015-02-03 MED ORDER — ACETAMINOPHEN 500 MG PO TABS: 1000.0000 mg | ORAL_TABLET | Freq: Four times a day (QID) | ORAL | Status: DC | PRN

## 2015-02-03 MED ORDER — LACTATED RINGERS IV SOLN
INTRAVENOUS | Status: DC | PRN
  Administered 2015-02-03: 07:00:00 via INTRAVENOUS

## 2015-02-03 MED ORDER — PROPOFOL 10 MG/ML IV BOLUS
INTRAVENOUS | Status: DC | PRN
  Administered 2015-02-03: 30 mg via INTRAVENOUS
  Administered 2015-02-03: 170 mg via INTRAVENOUS

## 2015-02-03 MED ORDER — FENTANYL CITRATE 0.05 MG/ML IJ SOLN: 25.0000 ug | INTRAMUSCULAR | Status: DC | PRN

## 2015-02-03 MED ORDER — SODIUM CHLORIDE 0.9 % IR SOLN
Status: DC | PRN
  Administered 2015-02-03 (×2): 1000 mL
  Administered 2015-02-03: 1000 mL via INTRAVESICAL

## 2015-02-03 MED ORDER — HYDROMORPHONE HCL 1 MG/ML IJ SOLN: 0.5000 mg | INTRAMUSCULAR | Status: DC | PRN

## 2015-02-03 MED ORDER — HYDROCODONE-ACETAMINOPHEN 5-325 MG PO TABS: 1.0000 | ORAL_TABLET | ORAL | Status: AC | PRN

## 2015-02-03 MED ORDER — CARVEDILOL 6.25 MG PO TABS: 6.2500 mg | ORAL_TABLET | Freq: Two times a day (BID) | ORAL | Status: DC

## 2015-02-03 MED ORDER — CIPROFLOXACIN IN D5W 400 MG/200ML IV SOLN
INTRAVENOUS | Status: AC
  Filled 2015-02-03: qty 200

## 2015-02-03 MED ORDER — ONDANSETRON HCL 4 MG/2ML IJ SOLN
INTRAMUSCULAR | Status: AC
  Filled 2015-02-03: qty 2

## 2015-02-03 MED ORDER — PANTOPRAZOLE SODIUM 40 MG PO TBEC: 40.0000 mg | DELAYED_RELEASE_TABLET | Freq: Every day | ORAL | Status: DC

## 2015-02-03 MED ORDER — FUROSEMIDE 20 MG PO TABS: 20.0000 mg | ORAL_TABLET | Freq: Every morning | ORAL | Status: DC

## 2015-02-03 MED ORDER — EPHEDRINE SULFATE 50 MG/ML IJ SOLN
INTRAMUSCULAR | Status: DC | PRN
  Administered 2015-02-03 (×3): 5 mg via INTRAVENOUS

## 2015-02-03 MED ORDER — HYDROCODONE-ACETAMINOPHEN 5-325 MG PO TABS
1.0000 | ORAL_TABLET | ORAL | Status: DC | PRN
  Administered 2015-02-03 (×2): 2 via ORAL
  Filled 2015-02-03 (×2): qty 2

## 2015-02-03 MED ORDER — FENTANYL CITRATE 0.05 MG/ML IJ SOLN
INTRAMUSCULAR | Status: AC
  Filled 2015-02-03: qty 2

## 2015-02-03 MED ORDER — DEXAMETHASONE SODIUM PHOSPHATE 10 MG/ML IJ SOLN
INTRAMUSCULAR | Status: AC
  Filled 2015-02-03: qty 1

## 2015-02-03 MED ORDER — SODIUM CHLORIDE 0.9 % IJ SOLN
INTRAMUSCULAR | Status: AC
  Filled 2015-02-03: qty 10

## 2015-02-03 MED ORDER — FENTANYL CITRATE 0.05 MG/ML IJ SOLN
INTRAMUSCULAR | Status: DC | PRN
  Administered 2015-02-03 (×4): 25 ug via INTRAVENOUS

## 2015-02-03 MED ORDER — DOCUSATE SODIUM 100 MG PO CAPS
100.0000 mg | ORAL_CAPSULE | Freq: Two times a day (BID) | ORAL | Status: DC
  Filled 2015-02-03: qty 1

## 2015-02-03 MED ORDER — LIDOCAINE HCL (CARDIAC) 20 MG/ML IV SOLN
INTRAVENOUS | Status: AC
  Filled 2015-02-03: qty 5

## 2015-02-03 MED ORDER — DEXAMETHASONE SODIUM PHOSPHATE 10 MG/ML IJ SOLN
INTRAMUSCULAR | Status: DC | PRN
  Administered 2015-02-03: 10 mg via INTRAVENOUS

## 2015-02-03 MED ORDER — METFORMIN HCL 500 MG PO TABS
500.0000 mg | ORAL_TABLET | Freq: Two times a day (BID) | ORAL | Status: DC
  Filled 2015-02-03 (×2): qty 1

## 2015-02-03 MED ORDER — LACTATED RINGERS IV SOLN: INTRAVENOUS | Status: DC

## 2015-02-03 MED ORDER — CIPROFLOXACIN IN D5W 400 MG/200ML IV SOLN
400.0000 mg | INTRAVENOUS | Status: AC
  Administered 2015-02-03: 400 mg via INTRAVENOUS

## 2015-02-03 MED ORDER — OXYBUTYNIN CHLORIDE 5 MG PO TABS: 5.0000 mg | ORAL_TABLET | Freq: Three times a day (TID) | ORAL | Status: AC | PRN

## 2015-02-03 SURGICAL SUPPLY — 24 items
BAG URO CATCHER STRL LF (DRAPE) ×4 IMPLANT
BASKET ZERO TIP NITINOL 2.4FR (BASKET) ×4 IMPLANT
BSKT STON RTRVL ZERO TP 2.4FR (BASKET) ×1
CATH INTERMIT  6FR 70CM (CATHETERS) ×4 IMPLANT
CATH URET 5FR 28IN CONE TIP (BALLOONS)
CATH URET 5FR 70CM CONE TIP (BALLOONS) IMPLANT
CLOTH BEACON ORANGE TIMEOUT ST (SAFETY) IMPLANT
FIBER LASER FLEXIVA 1000 (UROLOGICAL SUPPLIES) IMPLANT
FIBER LASER FLEXIVA 200 (UROLOGICAL SUPPLIES) ×4 IMPLANT
FIBER LASER FLEXIVA 365 (UROLOGICAL SUPPLIES) IMPLANT
FIBER LASER FLEXIVA 550 (UROLOGICAL SUPPLIES) IMPLANT
FIBER LASER TRAC TIP (UROLOGICAL SUPPLIES) ×4 IMPLANT
GLOVE BIOGEL M 8.0 STRL (GLOVE) ×4 IMPLANT
GOWN STRL REUS W/ TWL XL LVL3 (GOWN DISPOSABLE) ×2 IMPLANT
GOWN STRL REUS W/TWL XL LVL3 (GOWN DISPOSABLE) ×6 IMPLANT
MANIFOLD NEPTUNE II (INSTRUMENTS) ×4 IMPLANT
PACK CYSTO (CUSTOM PROCEDURE TRAY) ×4 IMPLANT
SHEATH ACCESS URETERAL 38CM (SHEATH) ×4 IMPLANT
SHIELD EYE BINOCULAR (MISCELLANEOUS) ×4 IMPLANT
STENT URET 6FRX24 CONTOUR (STENTS) ×4 IMPLANT
SYRINGE IRR TOOMEY STRL 70CC (SYRINGE) IMPLANT
TUBING CONNECTING 10 (TUBING) ×3 IMPLANT
TUBING CONNECTING 10' (TUBING) ×1
WIRE COONS/BENSON .038X145CM (WIRE) IMPLANT

## 2015-02-03 NOTE — Progress Notes (Signed)
Pt voicing need to try to urinate. Pt on the way to the bathroom urinated moderate amount of bloody urine on the floor. One small clot noted with the urine.

## 2015-02-03 NOTE — Progress Notes (Signed)
PT tried to ambulate to bathroom. Pt had second incontinent episode of urine bloody one small clot noted

## 2015-02-03 NOTE — Progress Notes (Signed)
Called Dr. Gentry RochJudd send patient over  After patient's CBG of 246  given

## 2015-02-03 NOTE — Progress Notes (Signed)
Inpatient Diabetes Program Recommendations  AACE/ADA: New Consensus Statement on Inpatient Glycemic Control (2013)  Target Ranges:  Prepandial:   less than 140 mg/dL      Peak postprandial:   less than 180 mg/dL (1-2 hours)      Critically ill patients:  140 - 180 mg/dL   Reason for Assessment: Hyperglycemia  Results for Jearld PiesROYSTON, Makhai C (MRN [id number]) as of 02/03/2015 16:24  Ref. Range 02/03/2015 04:49  Glucose-Capillary Latest Range: 70-99 mg/dL 478246 (H)   Needs basal insulin.  Inpatient Diabetes Program Recommendations Insulin - Basal: Consider adding Lantus 8 units QHS (on Lantus 10 units QHS at home) Correction (SSI): Add Novolog sensitive tidwc Diet: Add CHO mod med diet  Note: Will follow. Thank you. Ailene Ardshonda Fleetwood Pierron, RD, LDN, CDE Inpatient Diabetes Coordinator [phone]

## 2015-02-03 NOTE — Discharge Instructions (Signed)
POSTOPERATIVE CARE AFTER URETEROSCOPY  Stent management  *Stents are often left in after ureteroscopy and stone treatment. If left in, they often cause urinary frequency, urgency, occasional blood in the urine, as well as flank discomfort with urination. These are all expected issues, and should resolve after the stent is removed. *Often times, a small thread is left on the end of the stent, and brought out through the urethra. If so, this is used to remove the stent, making it unnecessary to look in the bladder with a scope in the office to remove the stent. If a thread is left on, did not pull on it until instructed. It is okay to pull this out on Thursday morning.  Diet  Once you have adequately recovered from anesthesia, you may gradually advance your diet, as tolerated, to your regular diet.  Activities  You may gradually increase your activities to your normal unrestricted level the day following your procedure.  Medications  You should resume all preoperative medications. If you are on aspirin-like compounds, you should not resume these until the blood clears from your urine. If given an antibiotic by the surgeon, take these until they are completed. You may also be given, if you have a stent, medications to decrease the urinary frequency and urgency.  Pain  After ureteroscopy, there may be some pain on the side of the scope. Take your pain medicine for this. Usually, this pain resolves within a day or 2.  Fever  Please report any fever over 100 to the doctor.

## 2015-02-03 NOTE — Anesthesia Postprocedure Evaluation (Signed)
  Anesthesia Post-op Note  Patient: Roger [name]  Procedure(s) Performed: Procedure(s) (LRB): CYSTOSCOPY/RETROGRADE/URETEROSCOPY, STONE BASKETRY, STENT PLACEMENT (Right) HOLMIUM LASER APPLICATION (Right)  Patient Location: PACU  Anesthesia Type: General  Level of Consciousness: awake and alert   Airway and Oxygen Therapy: Patient Spontanous Breathing  Post-op Pain: mild  Post-op Assessment: Post-op Vital signs reviewed, Patient's Cardiovascular Status Stable, Respiratory Function Stable, Patent Airway and No signs of Nausea or vomiting  Last Vitals:  Filed Vitals:   02/03/15 0906  BP: 115/69  Pulse:   Temp: 36.4 C  Resp:     Post-op Vital Signs: stable   Complications: No apparent anesthesia complications

## 2015-02-03 NOTE — Transfer of Care (Signed)
Immediate Anesthesia Transfer of Care Note  Patient: Roger [name]  Procedure(s) Performed: Procedure(s): CYSTOSCOPY/RETROGRADE/URETEROSCOPY, STONE BASKETRY, STENT PLACEMENT (Right) HOLMIUM LASER APPLICATION (Right)  Patient Location: PACU  Anesthesia Type:General  Level of Consciousness: awake, alert  and oriented  Airway & Oxygen Therapy: Patient Spontanous Breathing and Patient connected to face mask oxygen  Post-op Assessment: Report given to RN and Post -op Vital signs reviewed and stable  Post vital signs: Reviewed and stable  Last Vitals:  Filed Vitals:   02/03/15 0445  BP: 125/75  Pulse: 64  Temp: 36.3 C  Resp: 18    Complications: No apparent anesthesia complications

## 2015-02-03 NOTE — H&P (Signed)
Urology History and Physical Exam  CC: Kidney stones  HPI: 64 year old male presents for anesthetic management of symptomatic right sided ureteral calculi. He presented in Nov 2015 with recurrent hematuria as well as right sided flank pain. Evaluation included Ct A/P which revealed 3 right ureteral stones--2 at the UVJ and 1 in the mid ureter. Additionally, he had a 10 mm right renal pelvic stone. He has passed 1-2 stones since his initial presentation..  He has significant medical issues--alcoholic liver disease (no current EtOH use) with resultant splenomegaly and subsequent thrombocytopenia. He has been seen by Dr Myna Hidalgo of the [facility] and is felt to be fit for intervention with ureteroscopy w/o the need for platelet infusion. Additionally, he has a history of CAD and has had a CABG in the past. He was recently found to have mild ischemia on ETT, but has an EF of 65%. He has received cardiology clearance for anesthetic.  PMH: Past Medical History  Diagnosis Date  . Depression   . Diabetes mellitus   . Hypertension   . Hyperlipidemia     under control  . Agent orange exposure   . Thrombocytopenia   . Spleen enlarged   . Platelets decreased   . Fatty liver   . History of kidney stones   . GERD (gastroesophageal reflux disease)     under control  . Arthritis     R pinky finger  . Hernia, abdominal     ~2    PSH: Past Surgical History  Procedure Laterality Date  . Bypass graft  05-24-08    triple dr [place] Crane --[facility]  . Finger surgery  02/27/2011    amputated left ring finger  . Coronary artery bypass graft  2009    triple  . Uvulopalatopharyngoplasty  2003  . Refractive surgery Right 2011    Allergies: No Known Allergies  Medications: Prescriptions prior to admission  Medication Sig Dispense Refill Last Dose  . carvedilol (COREG) 6.25 MG tablet Take 6.25 mg by mouth 2 (two) times daily.   02/02/2015 at [id number] time  . CINNAMON PO Take 1 capsule by  mouth every morning.   02/02/2015 at Unknown time  . furosemide (LASIX) 20 MG tablet Take 20 mg by mouth every morning.   02/02/2015 at Unknown time  . metFORMIN (GLUCOPHAGE) 500 MG tablet Take by mouth 2 (two) times daily with a meal.   02/02/2015 at am  . omeprazole (PRILOSEC) 20 MG capsule Take 20 mg by mouth every evening.   02/02/2015 at Unknown time  . traZODone (DESYREL) 100 MG tablet Take 100 mg by mouth at bedtime.   02/02/2015 at Unknown time  . insulin glargine (LANTUS) 100 UNIT/ML injection Inject 10 Units into the skin at bedtime.   More than a month at Unknown time     Social History: History   Social History  . Marital Status: Widowed    Spouse Name: N/A  . Number of Children: N/A  . Years of Education: N/A   Occupational History  . [employer]    Social History Main Topics  . Smoking status: Never Smoker   . Smokeless tobacco: Never Used  . Alcohol Use: No  . Drug Use: No  . Sexual Activity: Not on file   Other Topics Concern  . Not on file   Social History Narrative    Family History: Family History  Problem Relation Age of Onset  . Diabetes Mother   . Heart disease  Mother     chf  . Cancer Brother [id number]    esopageal    Review of Systems: Genitourinary, constitutional, skin, eye, otolaryngeal, hematologic/lymphatic, cardiovascular, pulmonary, endocrine, musculoskeletal, gastrointestinal, neurological and psychiatric system(s) were reviewed and pertinent findings if present are noted.  Genitourinary: urinary frequency, feelings of urinary urgency, nocturia, incontinence, hematuria and erectile dysfunction.  Gastrointestinal: vomiting and diarrhea.  Eyes: blurred vision.  ENT: sore throat.  Hematologic/Lymphatic: a tendency to easily bruise.  Respiratory: cough.  Musculoskeletal: back pain.  Psychiatric: anxiety and depression.                 Physical Exam: @VITALS2 @ Constitutional: Well nourished and well developed . No acute  distress.  ENT:. The ears and nose are normal in appearance.  Neck: The appearance of the neck is normal and no neck mass is present.  Pulmonary: No respiratory distress and normal respiratory rhythm and effort.  Abdomen: The abdomen is rounded. The abdomen is soft and nontender. No masses are palpated. No CVA tenderness. No hernias are palpable. No hepatosplenomegaly noted.  Rectal: Rectal exam demonstrates normal sphincter tone, the anus is normal on inspection., no tenderness and no masses. Estimated prostate size is 2+. Normal rectal tone, no rectal masses, prostate is smooth, symmetric and non-tender. The prostate has no nodularity and is not tender. The left seminal vesicle is nonpalpable. The right seminal vesicle is nonpalpable. The perineum is normal on inspection.  Genitourinary: Examination of the penis demonstrates no discharge, no masses, no lesions and a normal meatus. The penis is circumcised. The scrotum is normal in appearance and without lesions. The right epididymis is palpably normal and non-tender. The left epididymis is palpably normal and non-tender. The right testis is palpably normal, non-tender and without masses. The left testis is normal, non-tender and without masses.  Lymphatics: The femoral and inguinal nodes are not enlarged or tender.  Skin: Normal skin turgor, no visible rash and no visible skin lesions.  Neuro/Psych:. Mood and affect are appropriate.   Studies:  Recent Labs     02/03/15  0530  HGB  13.5  WBC  6.0  PLT  35*    No results for input(s): NA, K, CL, CO2, BUN, CREATININE, CALCIUM, GFRNONAA, GFRAA in the last 72 hours.  Invalid input(s): MAGNESIUM   No results for input(s): INR, APTT in the last 72 hours.  Invalid input(s): PT   Invalid input(s): ABG    Assessment:  1. Right sided urolitiasis--symptomatic with flank pain and hematuria.  2. Thrombocytopenia secondary to splenomegaly--cleared for endoscopic procedure without the need for  platelet transfusion by hematology/Dr Ennever  3. CAD--s/p CABG. Recent CHF evaluated/cleared by cardiology in [place]. EF 65%.  Plan: Cystoscopy, right RGP, right ureteroscopy w/ holmium laser of renal pelvic stone

## 2015-02-03 NOTE — Op Note (Addendum)
PATIENT:  Roger [name]  PRE-OPERATIVE DIAGNOSIS: right ureteral calculus  POST-OPERATIVE DIAGNOSIS: Same  PROCEDURE: cystoscopy, right retrograde ureteropyelogram with interpretive fluoroscopy, flexible right ureteroscopy, holmium laserand extraction of right renal calculus, double-J stent placement (24 cm x 6 [place] contour with string)  SURGEON:  Bertram Millard. Saleen Peden, M.D.  ANESTHESIA:  General  EBL:  Minimal  DRAINS: 24 cm x 6 French contour double-J stent  LOCAL MEDICATIONS USED:  None  SPECIMEN:  Stone fragments, to the patient  INDICATION: Roger [name] is a 64 year old male with symptomatic right renal stone. He has thrombocytopenia felt to be secondary to splenomegaly, and his had intermittent gross hematuria and flank pain for several months. He passed several distal ureteral stones. He has had clearance by hematologyfor anesthetic/ureteroscopic procedure. He is additionally had urology clearance because of recent heart failure and history of coronary artery disease. We've talked about ureteroscopy and holmium laser of his stone as well as stent placement. Risks and complications of the procedure have been discussed with the patient. Because of his living a distance from here as well as his medical conditions, it was recommended that he spend the night following this procedure. He understands the procedures well as potential issues down the road and desires to proceed.   Description of procedure: The patient was properly identified and marked (if applicable) in the holding area. They were then  taken to the operating room and placed on the table in a supine position. General anesthesia was then administered. Once fully anesthetized the patient was moved to the dorsolithotomy position and the genitalia and perineum were sterilely prepped and draped in standard fashion. An official timeout was then performed.   A 22 French panendoscope was advanced through his urethra.  Urethra was normal, without stricture. Prostatic urethra was nonobstructive. His bladder was entered and inspected circumferentially. There were no tumors, trabeculations or foreign bodies. Ureteral orifices were normal in configuration and location. His right ureter was cannulated with a 6 [place] open-ended catheter. Using Omnipaque, a right retrograde ureteropyelogram was performed.  This revealed a normal ureter. There was no evidence of filling defect, stricture or hydronephrosis. The right renal pelvis and calyceal system were normal except for a filling defect in the right renal pelvis consistent with the previously noted right renal stone.  At this point, a sensor-tip guidewire was advanced through the open-ended catheter up to the renal pelvis were curl was seen fluoroscopically. The cystoscope and open-ended catheter removed. The ureter was first dilated with the inner core of a 12/14 ureteral access sheath. I then advanced the entire access sheath proximally in the ureter, and remove the core. The guidewire was additionally removed. I passed the 6 French dual-lumen digital ureteroscope through the sheath and into the right renal pelvis. A pale brown stone was identified in the pelvis. In the interpolar calyx, another smaller stone was identified. The 200 microns fiber was used to fragmented both of these stones. The Nitinol basket was then utilized to grasp all fragments and extracted through the access sheath. Tiny fragments were too small to be engaged by the Nitinol basket, and were left indwelling, with the knowledge that these will most likely washout following the procedure along the stent. Inspection of all calyces in the pelvis revealed no significant fragments remaining at this point. The ureteroscope was removed, a sensor-tip guidewire was replaced through the access sheath, and using a cystoscope over the guidewire, a 24 cm x 6 French contour stent was advanced into the right  renal pelvis.  After removal of the guidewire, excellent proximal and distal curls were seen on the stent. The string was left on and brought through the urethra after the bladder was drained. The scope was removed. The string was taped to the patient's penis.  The patient tolerated procedure well. Stone fragments will be given to the patient. He was taken to the PACU in stable condition.

## 2015-02-03 NOTE — Progress Notes (Signed)
CARE MANAGEMENT NOTE 02/03/2015  Patient:  Roger [name],Roger [name]   Account Number:  [id number]  Date Initiated:  02/03/2015  Documentation initiated by:  Lanier ClamMAHABIR,Senaida Chilcote  Subjective/Objective Assessment:   64 y/o m admitted w/R nephrolithiasis.     Action/Plan:   From home.   Anticipated DC Date:  02/04/2015   Anticipated DC Plan:  HOME/SELF CARE      DC Planning Services  CM consult      Choice offered to / List presented to:             Status of service:  In process, will continue to follow Medicare Important Message given?   (If response is "NO", the following Medicare IM given date fields will be blank) Date Medicare IM given:   Medicare IM given by:   Date Additional Medicare IM given:   Additional Medicare IM given by:    Discharge Disposition:    Per UR Regulation:  Reviewed for med. necessity/level of care/duration of stay  If discussed at Long Length of Stay Meetings, dates discussed:    Comments:  02/03/15 Lanier ClamKathy Abem Shaddix RN BSN NCM [id number] [phone] S/P cystoscopy, double J stent.No anticipated d/[name] needs.

## 2015-02-03 NOTE — Progress Notes (Signed)
Pt received from PACU to room 1412. Report from PACU RN

## 2015-02-04 ENCOUNTER — Encounter (HOSPITAL_COMMUNITY): Payer: Self-pay | Admitting: Urology

## 2015-02-10 NOTE — Discharge Summary (Signed)
Patient ID: Roger [name] MRN: [id number] DOB/AGE: [birth year] 65 y.o.  Admit date: 02/03/2015 Discharge date: 02/10/2015  Primary Care Physician:  No PCP Per Patient  Discharge Diagnoses:   1. Right renal calculus 2. 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 DM  Consults:  None   Discharge Medications:   Medication List    STOP taking these medications        insulin glargine 100 UNIT/ML injection  Commonly known as:  LANTUS      TAKE these medications        acetaminophen 500 MG tablet  Commonly known as:  TYLENOL  Take 1,000 mg by mouth every 6 (six) hours as needed for moderate pain.     carvedilol 6.25 MG tablet  Commonly known as:  COREG  Take 6.25 mg by mouth 2 (two) times daily.     CINNAMON PO  Take 1 capsule by mouth every morning.     furosemide 20 MG tablet  Commonly known as:  LASIX  Take 20 mg by mouth every morning.     HYDROcodone-acetaminophen 5-325 MG per tablet  Commonly known as:  NORCO/VICODIN  Take 1-2 tablets by mouth every 4 (four) hours as needed for moderate pain.     metFORMIN 500 MG tablet  Commonly known as:  GLUCOPHAGE  Take 500 mg by mouth 2 (two) times daily with a meal.     omeprazole 20 MG capsule  Commonly known as:  PRILOSEC  Take 20 mg by mouth every evening.     oxybutynin 5 MG tablet  Commonly known as:  DITROPAN  Take 1 tablet (5 mg total) by mouth every 8 (eight) hours as needed for bladder spasms.     traZODone 100 MG tablet  Commonly known as:  DESYREL  Take 100 mg by mouth at bedtime.         Significant Diagnostic Studies:  No results found.  Brief H and P: For complete details please refer to admission H and P, but in brief the patient was admitted for endoscopic management of left renal and ureteral calculi with a long history of pain and hematuria.  Hospital Course:  Active Problems:   Right nephrolithiasis The patient was sent to the floor for  postoperative observation after his ureteroscopy, as there was concern for postoperative bleeding due to his severe thrombocytopenia. At his postop check, he was voiding well and did not have significant gross hematuria. He was discharged home the evening of his surgery in stable condition.  Day of Discharge BP 136/60 mmHg  Pulse 60  Temp(Src) 97.6 F (36.4 C) (Oral)  Resp 20  Ht  (1.778 m)  Wt 84.426 kg (186 lb 2 oz)  BMI 26.71 kg/m2  SpO2 100%  No results found for this or any previous visit (from the past 24 hour(s)).  Physical Exam: General: Alert and awake oriented x3 not in any acute distress. HEENT: anicteric sclera, pupils reactive to light and accommodation CVS: S1-S2 clear no murmur rubs or gallops Chest: clear to auscultation bilaterally, no wheezing rales or rhonchi Abdomen: soft nontender, nondistended, normal bowel sounds, no organomegaly Extremities: no cyanosis, clubbing or edema noted bilaterally Neuro: Cranial nerves II-XII intact, no focal neurological deficits  Disposition:  Home with son  Diet:  No restrictions  Activity:  No restrictions   Disposition and Follow-up:     Discharge Instructions    Discharge patient    Complete by:  As directed  Follow-up as scheduled  TESTS THAT NEED FOLLOW-UP    DISCHARGE FOLLOW-UP Follow-up Information    Follow up with Chelsea AusAHLSTEDT, Wana Mount M, MD.   Specialty:  Urology   Contact information:   [address] [place] [id number] [phone]       Time spent on Discharge:  15 minutes  Signed: Chelsea AusDAHLSTEDT, Jadarius Commons M 02/10/2015, 9:15 AM

## 2015-03-04 IMAGING — CR DG CHEST 2V
2 series · 2 of 2 positions shown · non-contrast
Comparison: None.

CLINICAL DATA: Preop kidney stone removal, no chest complaints

EXAM:
CHEST  2 VIEW

[w chest pa]
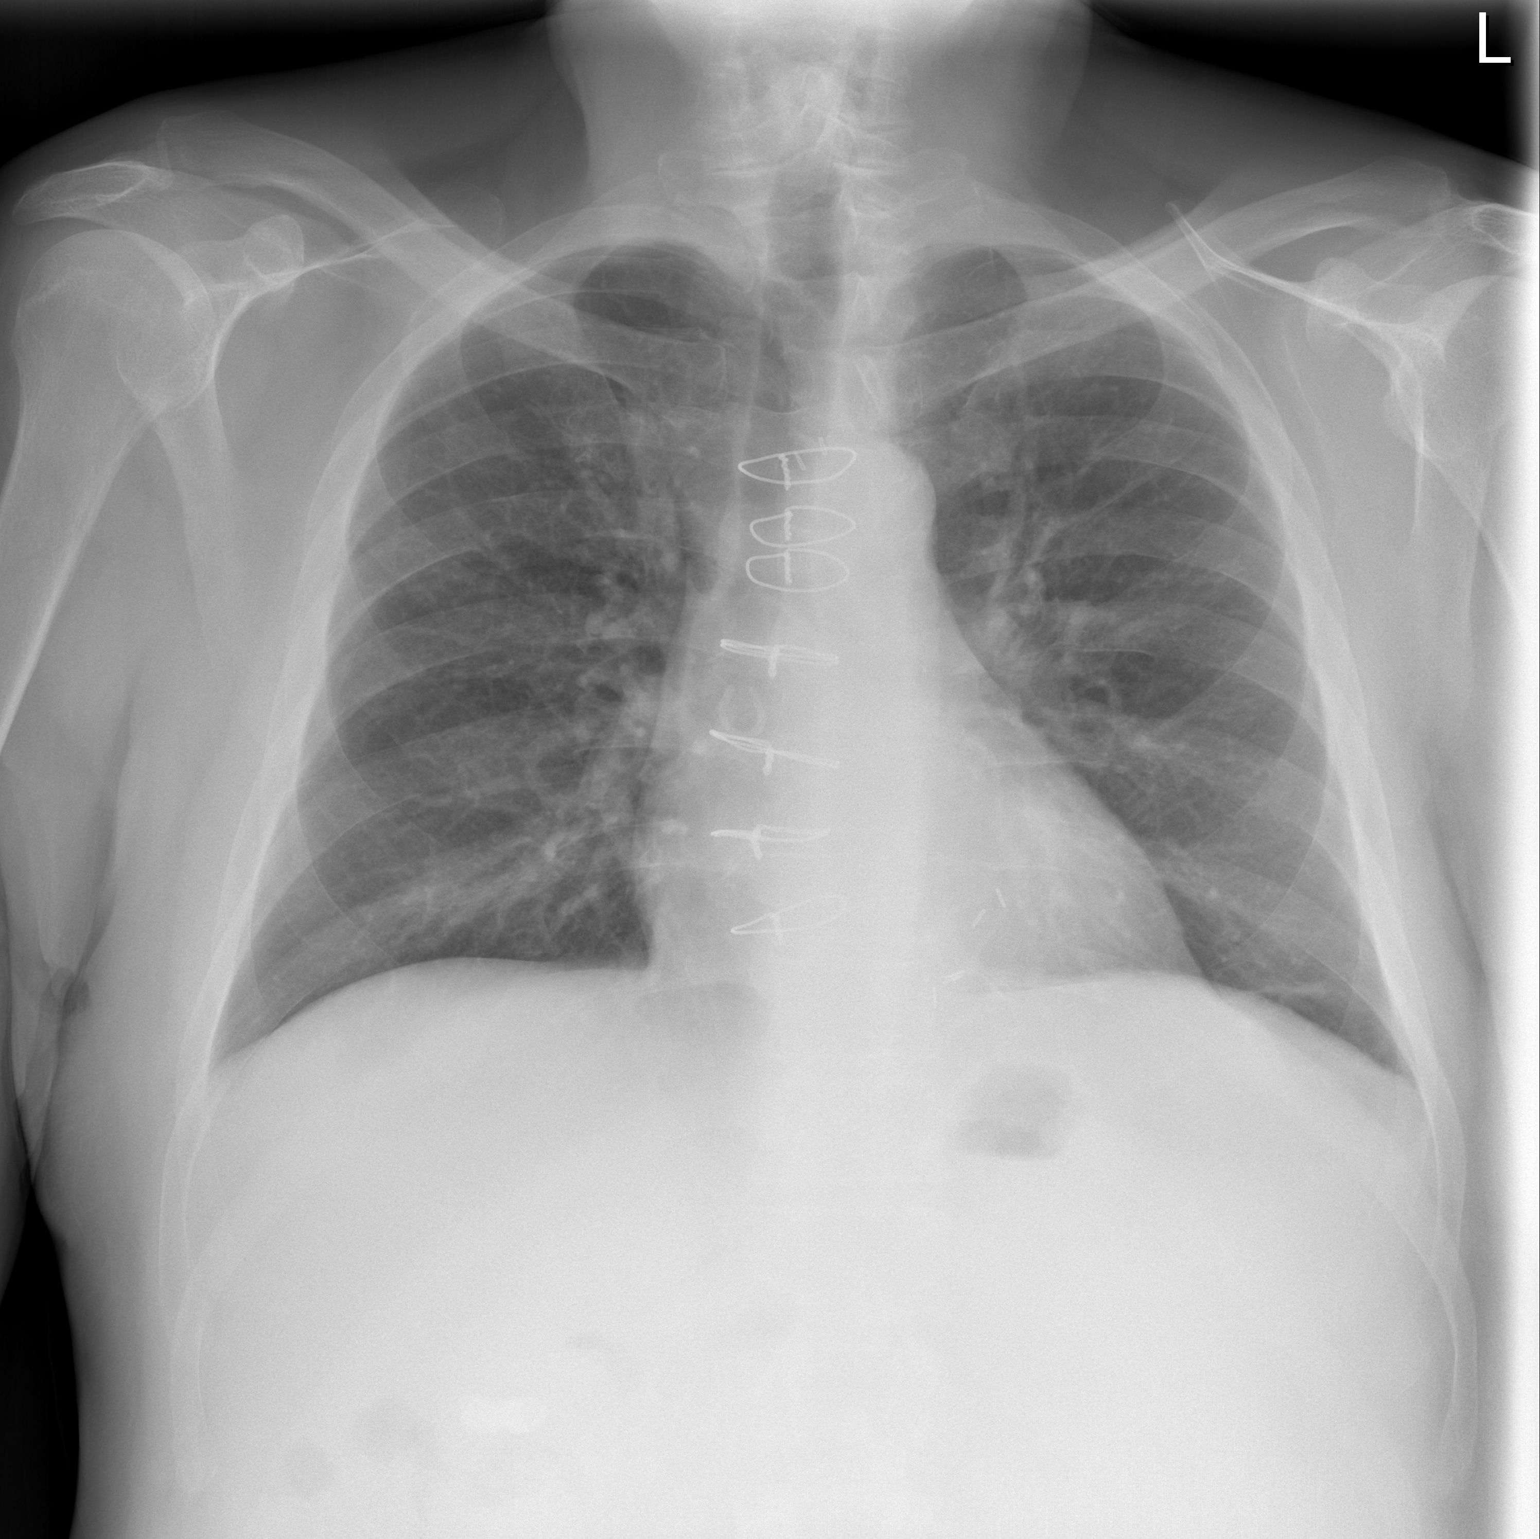

[w chest lat]
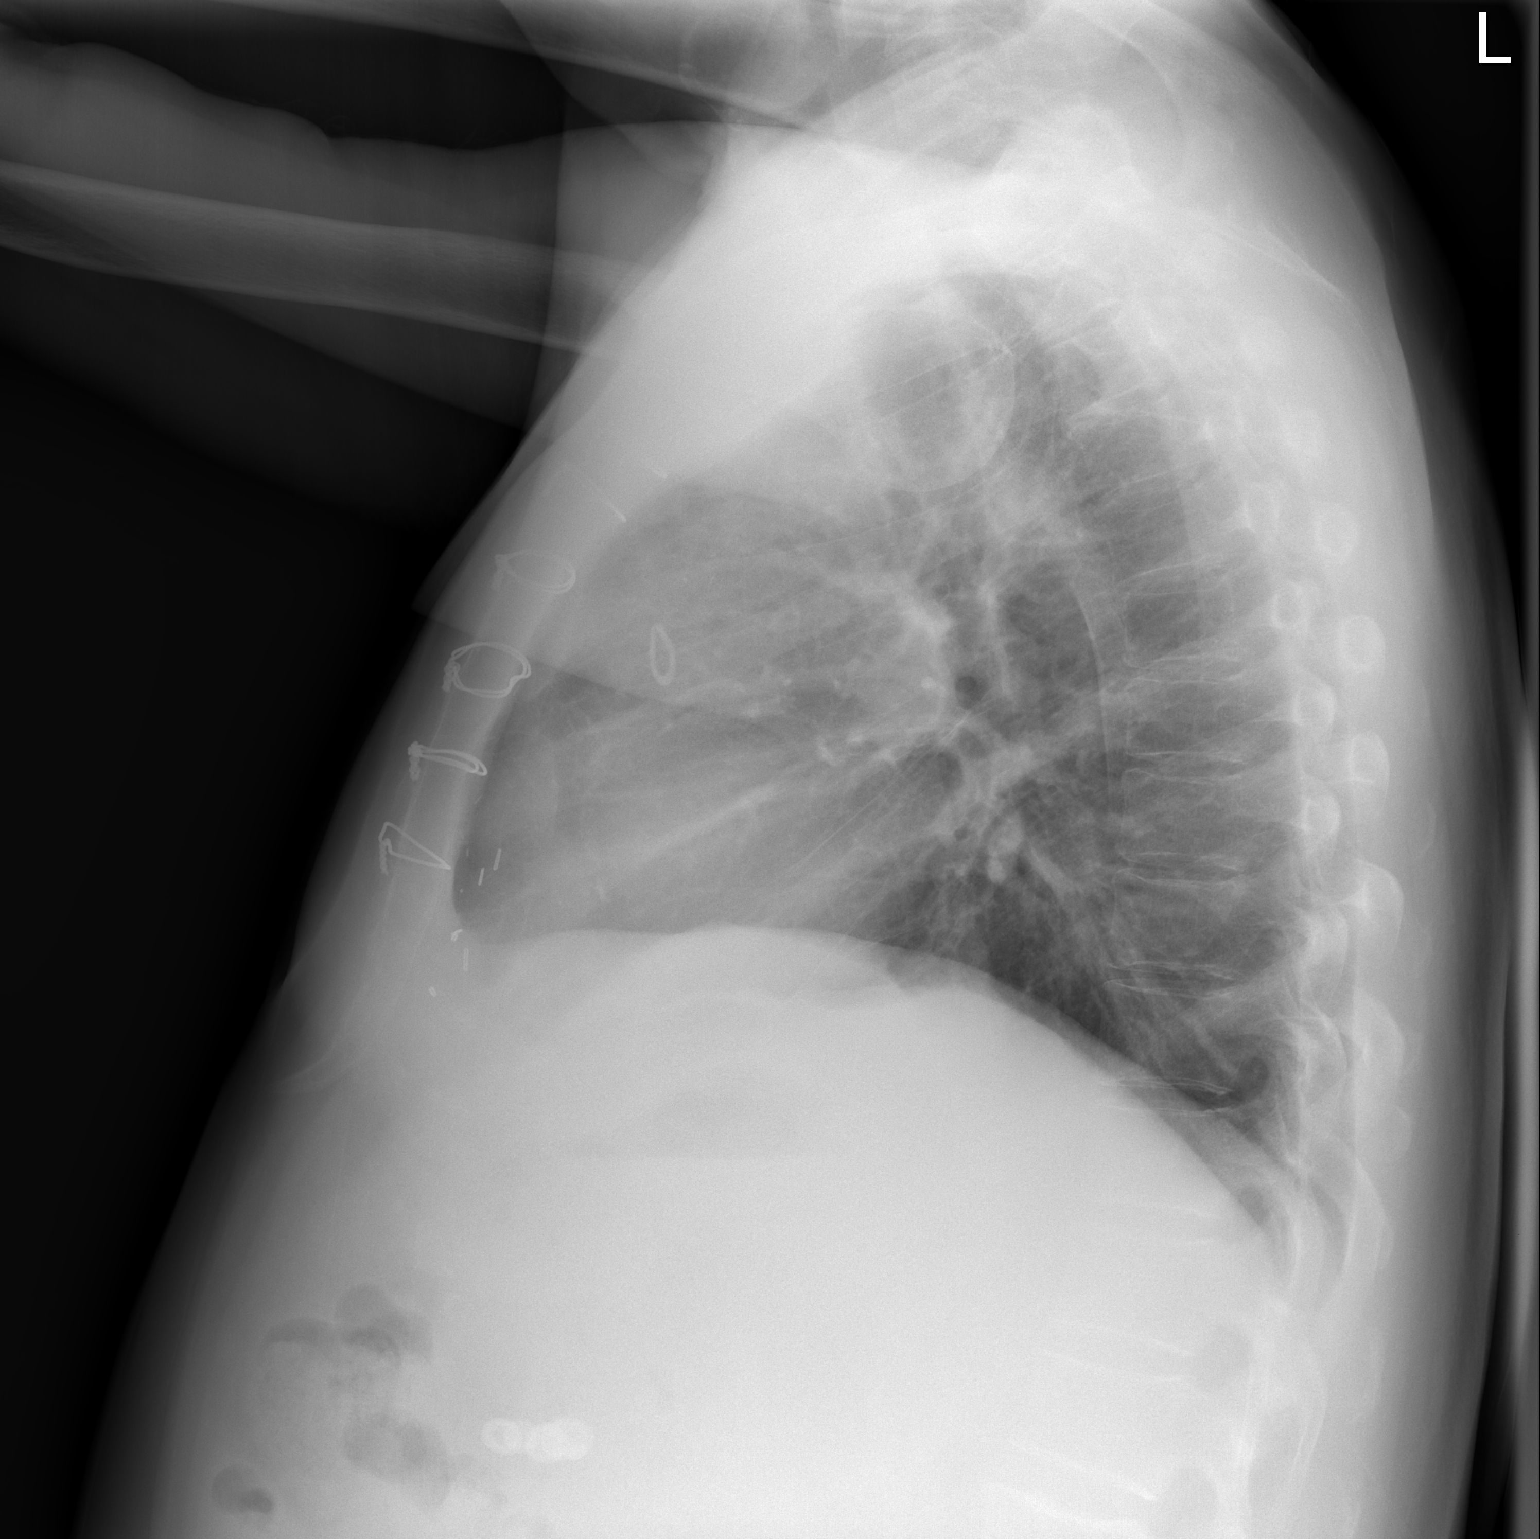

[2 of 2 positions shown; findings below may reference images not displayed]

FINDINGS: There is no focal parenchymal opacity, pleural effusion, or
pneumothorax. The heart and mediastinal contours are unremarkable.
There is evidence of prior CABG.The osseous structures are
unremarkable.
IMPRESSION: No active cardiopulmonary disease.

## 2016-01-21 DEATH — deceased
